# Patient Record
Sex: Male | Born: 1980 | Race: White | Hispanic: No | Marital: Married | Smoking: Never smoker
Health system: Southern US, Community
[De-identification: ages and names within clinical notes are randomized; demographics above are authoritative.]

## PROBLEM LIST (undated history)

## (undated) DIAGNOSIS — B019 Varicella without complication: Secondary | ICD-10-CM

## (undated) DIAGNOSIS — J45909 Unspecified asthma, uncomplicated: Secondary | ICD-10-CM

## (undated) DIAGNOSIS — M549 Dorsalgia, unspecified: Secondary | ICD-10-CM

## (undated) DIAGNOSIS — M542 Cervicalgia: Secondary | ICD-10-CM

## (undated) DIAGNOSIS — U071 COVID-19: Secondary | ICD-10-CM

## (undated) DIAGNOSIS — B009 Herpesviral infection, unspecified: Secondary | ICD-10-CM

## (undated) HISTORY — DX: Dorsalgia, unspecified: M54.9

## (undated) HISTORY — DX: COVID-19: U07.1

## (undated) HISTORY — DX: Herpesviral infection, unspecified: B00.9

## (undated) HISTORY — DX: Cervicalgia: M54.2

## (undated) HISTORY — DX: Varicella without complication: B01.9

## (undated) HISTORY — DX: Unspecified asthma, uncomplicated: J45.909

## (undated) HISTORY — PX: NO PAST SURGERIES: SHX2092

---

## 2014-05-16 ENCOUNTER — Emergency Department: Payer: Self-pay | Admitting: Internal Medicine

## 2014-05-23 ENCOUNTER — Emergency Department: Payer: Self-pay | Admitting: Emergency Medicine

## 2017-10-23 ENCOUNTER — Encounter: Payer: Self-pay | Admitting: Internal Medicine

## 2017-10-23 ENCOUNTER — Ambulatory Visit: Payer: 59 | Admitting: Internal Medicine

## 2017-10-23 ENCOUNTER — Ambulatory Visit (INDEPENDENT_AMBULATORY_CARE_PROVIDER_SITE_OTHER): Payer: 59

## 2017-10-23 VITALS — BP 126/66 | HR 67 | Temp 97.9°F | Ht 66.0 in | Wt 159.2 lb

## 2017-10-23 DIAGNOSIS — M545 Low back pain: Secondary | ICD-10-CM

## 2017-10-23 DIAGNOSIS — Z0000 Encounter for general adult medical examination without abnormal findings: Secondary | ICD-10-CM | POA: Diagnosis not present

## 2017-10-23 DIAGNOSIS — Z1329 Encounter for screening for other suspected endocrine disorder: Secondary | ICD-10-CM | POA: Diagnosis not present

## 2017-10-23 DIAGNOSIS — Z1159 Encounter for screening for other viral diseases: Secondary | ICD-10-CM | POA: Diagnosis not present

## 2017-10-23 DIAGNOSIS — M542 Cervicalgia: Secondary | ICD-10-CM | POA: Diagnosis not present

## 2017-10-23 DIAGNOSIS — Z23 Encounter for immunization: Secondary | ICD-10-CM | POA: Diagnosis not present

## 2017-10-23 DIAGNOSIS — Z13220 Encounter for screening for lipoid disorders: Secondary | ICD-10-CM | POA: Diagnosis not present

## 2017-10-23 DIAGNOSIS — G8929 Other chronic pain: Secondary | ICD-10-CM

## 2017-10-23 MED ORDER — METHOCARBAMOL 500 MG PO TABS: 500.0000 mg | ORAL_TABLET | Freq: Two times a day (BID) | ORAL | 2 refills | Status: DC | PRN

## 2017-10-23 NOTE — Progress Notes (Signed)
Chief Complaint  Patient presents with  . New Patient (Initial Visit)  . Back Pain  . Neck Pain   New patient here with wife  1. C/o neck and low back pain chronic worsening x 6 months no h/o MVA/trauma tries Ibuprofen/Alevel w/o help. He does not do heavy lifting with work. He does report tingling down arms. He saw chiropractor 3 years ago but it did not help. Pain is achy/sharp at times 10/10. He has never tried PT for pain. He does have pain with ROM but no limited ROM neck.  2. Annual physical    Review of Systems  Constitutional: Negative for weight loss.  HENT: Negative for hearing loss.   Eyes: Negative for blurred vision.  Cardiovascular: Negative for chest pain.  Gastrointestinal: Negative for abdominal pain.  Musculoskeletal: Positive for back pain and neck pain.  Skin: Negative for rash.       +moles   Neurological: Negative for headaches.  Psychiatric/Behavioral: Negative for memory loss.       +angry at times    Past Medical History:  Diagnosis Date  . Asthma    childhood   . Chicken pox    Past Surgical History:  Procedure Laterality Date  . NO PAST SURGERIES     Family History  Problem Relation Age of Onset  . COPD Mother        never smoker   . Cancer Father        tongue cancer smoker   . Hypertension Father    Social History   Socioeconomic History  . Marital status: Married    Spouse name: Not on file  . Number of children: Not on file  . Years of education: Not on file  . Highest education level: Not on file  Social Needs  . Financial resource strain: Not on file  . Food insecurity - worry: Not on file  . Food insecurity - inability: Not on file  . Transportation needs - medical: Not on file  . Transportation needs - non-medical: Not on file  Occupational History  . Not on file  Tobacco Use  . Smoking status: Never Smoker  . Smokeless tobacco: Never Used  Substance and Sexual Activity  . Alcohol use: Yes  . Drug use: No  . Sexual  activity: Yes    Partners: Female  Other Topics Concern  . 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     Wears seatbelt   Owns gun    Feels safe in relationship    Current Meds  Medication Sig  . valACYclovir (VALTREX) 500 MG tablet Take 500 mg by mouth 2 (two) times daily.   No Known Allergies No results found for this or any previous visit (from the past 2160 hour(s)). Objective  Body mass index is 25.7 kg/m. Wt Readings from Last 3 Encounters:  10/23/17 159 lb 3.2 oz (72.2 kg)   Temp Readings from Last 3 Encounters:  10/23/17 97.9 F (36.6 C) (Oral)   BP Readings from Last 3 Encounters:  10/23/17 126/66   Pulse Readings from Last 3 Encounters:  10/23/17 67   O2 sat room air 98%  Physical Exam  Constitutional: He is oriented to person, place, and time and well-developed, well-nourished, and in no distress. Vital signs are normal.  HENT:  Head: Normocephalic and atraumatic.  Mouth/Throat: Oropharynx is clear and moist and mucous membranes are normal.  Eyes: Conjunctivae are normal. Pupils are equal, round, and reactive to light.  Cardiovascular: Normal rate, regular rhythm and normal heart sounds.  No murmur heard. Pulmonary/Chest: Effort normal and breath sounds normal.  Musculoskeletal:       Cervical back: He exhibits tenderness.       Lumbar back: He exhibits no tenderness.       Back:  Neurological: He is alert and oriented to person, place, and time. Gait normal. Gait normal.  Skin: Skin is warm, dry and intact.  Psychiatric: Mood, memory, affect and judgment normal.  Nursing note and vitals reviewed.   Assessment   1. Chronic cervicalgia and low back pain  2. Hm/physical  Plan  1.  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  Declines flu  Given Tdap today  Will check fasting labs Thursday CMET, CBC, UA, lipid, TSH, T4, hep B titer  Declines STD check   Congratulated on never a smoker  rec healthy diet choices   Provider: Dr. French Anaracy [facility]

## 2017-10-23 NOTE — Patient Instructions (Signed)
Please try robaxin at night when you are not working  Try Tylenol 500 mg up to 6x per day  Try Ibuprofen 200 mg no more than 12 pills in 1 day  F/u in 6 weeks sooner if needed  Try over the counter Salonpas for pain topical gel/patch   Cervical Radiculopathy Cervical radiculopathy means that a nerve in the neck is pinched or bruised. This can cause pain or loss of feeling (numbness) that runs from your neck to your arm and fingers. Follow these instructions at home: Managing pain  Take over-the-counter and prescription medicines only as told by your doctor.  If directed, put ice on the injured or painful area. ? Put ice in a plastic bag. ? Place a towel between your skin and the bag. ? Leave the ice on for 20 minutes, 2-3 times per day.  If ice does not help, you can try using heat. Take a warm shower or warm bath, or use a heat pack as told by your doctor.  You may try a gentle neck and shoulder massage. Activity  Rest as needed. Follow instructions from your doctor about any activities to avoid.  Do exercises as told by your doctor or physical therapist. General instructions  If you were given a soft collar, wear it as told by your doctor.  Use a flat pillow when you sleep.  Keep all follow-up visits as told by your doctor. This is important. Contact a doctor if:  Your condition does not improve with treatment. Get help right away if:  Your pain gets worse and is not controlled with medicine.  You lose feeling or feel weak in your hand, arm, face, or leg.  You have a fever.  You have a stiff neck.  You cannot control when you poop or pee (have incontinence).  You have trouble with walking, balance, or talking. This information is not intended to replace advice given to you by your health care provider. Make sure you discuss any questions you have with your health care provider. Document Released: 08/11/2011 Document Revised: 01/28/2016 Document Reviewed:  10/16/2014 Elsevier Interactive Patient Education  2018 [employer]  Back Pain, Adult Many adults have back pain from time to time. Common causes of back pain include:  A strained muscle or ligament.  Wear and tear (degeneration) of the spinal disks.  Arthritis.  A hit to the back.  Back pain can be short-lived (acute) or last a long time (chronic). A physical exam, lab tests, and imaging studies may be done to find the cause of your pain. Follow these instructions at home: Managing pain and stiffness  Take over-the-counter and prescription medicines only as told by your health care provider.  If directed, apply heat to the affected area as often as told by your health care provider. Use the heat source that your health care provider recommends, such as a moist heat pack or a heating pad. ? Place a towel between your skin and the heat source. ? Leave the heat on for 20-30 minutes. ? Remove the heat if your skin turns bright red. This is especially important if you are unable to feel pain, heat, or cold. You have a greater risk of getting burned.  If directed, apply ice to the injured area: ? Put ice in a plastic bag. ? Place a towel between your skin and the bag. ? Leave the ice on for 20 minutes, 2-3 times a day for the first 2-3 days. Activity  Do not  stay in bed. Resting more than 1-2 days can delay your recovery.  Take short walks on even surfaces as soon as you are able. Try to increase the length of time you walk each day.  Do not sit, drive, or stand in one place for more than 30 minutes at a time. Sitting or standing for long periods of time can put stress on your back.  Use proper lifting techniques. When you bend and lift, use positions that put less stress on your back: ? [place] your knees. ? Keep the load close to your body. ? Avoid twisting.  Exercise regularly as told by your health care provider. Exercising will help your back heal faster. This also helps  prevent back injuries by keeping muscles strong and flexible.  Your health care provider may recommend that you see a physical therapist. This person can help you come up with a safe exercise program. Do any exercises as told by your physical therapist. Lifestyle  Maintain a healthy weight. Extra weight puts stress on your back and makes it difficult to have good posture.  Avoid activities or situations that make you feel anxious or stressed. Learn ways to manage anxiety and stress. One way to manage stress is through exercise. Stress and anxiety increase muscle tension and can make back pain worse. General instructions  Sleep on a firm mattress in a comfortable position. Try lying on your side with your knees slightly bent. If you lie on your back, put a pillow under your knees.  Follow your treatment plan as told by your health care provider. This may include: ? Cognitive or behavioral therapy. ? Acupuncture or massage therapy. ? Meditation or yoga. Contact a health care provider if:  You have pain that is not relieved with rest or medicine.  You have increasing pain going down into your legs or buttocks.  Your pain does not improve in 2 weeks.  You have pain at night.  You lose weight.  You have a fever or chills. Get help right away if:  You develop new bowel or bladder control problems.  You have unusual weakness or numbness in your arms or legs.  You develop nausea or vomiting.  You develop abdominal pain.  You feel faint. Summary  Many adults have back pain from time to time. A physical exam, lab tests, and imaging studies may be done to find the cause of your pain.  Use proper lifting techniques. When you bend and lift, use positions that put less stress on your back.  Take over-the-counter and prescription medicines and apply heat or ice as directed by your health care provider. This information is not intended to replace advice given to you by your health care  provider. Make sure you discuss any questions you have with your health care provider. Document Released: 08/22/2005 Document Revised: 09/26/2016 Document Reviewed: 09/26/2016 Elsevier Interactive Patient Education  [employer].

## 2017-10-23 NOTE — Progress Notes (Signed)
Pre visit review using our clinic review tool, if applicable. No additional management support is needed unless otherwise documented below in the visit note. 

## 2017-10-24 ENCOUNTER — Other Ambulatory Visit: Payer: Self-pay | Admitting: Internal Medicine

## 2017-10-24 DIAGNOSIS — M545 Low back pain: Secondary | ICD-10-CM

## 2017-10-24 DIAGNOSIS — M542 Cervicalgia: Secondary | ICD-10-CM

## 2017-10-24 DIAGNOSIS — G8929 Other chronic pain: Secondary | ICD-10-CM

## 2017-10-26 ENCOUNTER — Other Ambulatory Visit (INDEPENDENT_AMBULATORY_CARE_PROVIDER_SITE_OTHER): Payer: 59

## 2017-10-26 DIAGNOSIS — Z0000 Encounter for general adult medical examination without abnormal findings: Secondary | ICD-10-CM

## 2017-10-26 DIAGNOSIS — Z1159 Encounter for screening for other viral diseases: Secondary | ICD-10-CM

## 2017-10-26 DIAGNOSIS — Z13220 Encounter for screening for lipoid disorders: Secondary | ICD-10-CM | POA: Diagnosis not present

## 2017-10-26 DIAGNOSIS — Z1329 Encounter for screening for other suspected endocrine disorder: Secondary | ICD-10-CM

## 2017-10-26 LAB — URINALYSIS, ROUTINE W REFLEX MICROSCOPIC
BILIRUBIN URINE: NEGATIVE
HGB URINE DIPSTICK: NEGATIVE
Ketones, ur: NEGATIVE
LEUKOCYTES UA: NEGATIVE
NITRITE: NEGATIVE
RBC / HPF: NONE SEEN (ref 0–?)
Specific Gravity, Urine: 1.015 (ref 1.000–1.030)
TOTAL PROTEIN, URINE-UPE24: NEGATIVE
URINE GLUCOSE: NEGATIVE
Urobilinogen, UA: 0.2 (ref 0.0–1.0)
pH: 7 (ref 5.0–8.0)

## 2017-10-26 LAB — CBC WITH DIFFERENTIAL/PLATELET
Basophils Absolute: 0.1 10*3/uL (ref 0.0–0.1)
Basophils Relative: 1.2 % (ref 0.0–3.0)
EOS PCT: 5.8 % — AB (ref 0.0–5.0)
Eosinophils Absolute: 0.3 10*3/uL (ref 0.0–0.7)
HCT: 45.9 % (ref 39.0–52.0)
HEMOGLOBIN: 15.5 g/dL (ref 13.0–17.0)
Lymphocytes Relative: 34.1 % (ref 12.0–46.0)
Lymphs Abs: 1.6 10*3/uL (ref 0.7–4.0)
MCHC: 33.6 g/dL (ref 30.0–36.0)
MCV: 94.2 fl (ref 78.0–100.0)
MONO ABS: 0.6 10*3/uL (ref 0.1–1.0)
MONOS PCT: 12.1 % — AB (ref 3.0–12.0)
Neutro Abs: 2.2 10*3/uL (ref 1.4–7.7)
Neutrophils Relative %: 46.8 % (ref 43.0–77.0)
Platelets: 107 10*3/uL — ABNORMAL LOW (ref 150.0–400.0)
RBC: 4.87 Mil/uL (ref 4.22–5.81)
RDW: 12.9 % (ref 11.5–15.5)
WBC: 4.8 10*3/uL (ref 4.0–10.5)

## 2017-10-26 LAB — COMPREHENSIVE METABOLIC PANEL
ALK PHOS: 63 U/L (ref 39–117)
ALT: 21 U/L (ref 0–53)
AST: 20 U/L (ref 0–37)
Albumin: 4.1 g/dL (ref 3.5–5.2)
BILIRUBIN TOTAL: 0.9 mg/dL (ref 0.2–1.2)
BUN: 22 mg/dL (ref 6–23)
CO2: 31 mEq/L (ref 19–32)
CREATININE: 1.24 mg/dL (ref 0.40–1.50)
Calcium: 9.3 mg/dL (ref 8.4–10.5)
Chloride: 101 mEq/L (ref 96–112)
GFR: 69.44 mL/min (ref >60.00)
Glucose, Bld: 94 mg/dL (ref 70–99)
POTASSIUM: 4.2 meq/L (ref 3.5–5.1)
SODIUM: 137 meq/L (ref 135–145)
TOTAL PROTEIN: 7 g/dL (ref 6.0–8.3)

## 2017-10-26 LAB — LIPID PANEL
Cholesterol: 129 mg/dL (ref 0–200)
HDL: 43.5 mg/dL (ref >39.00)
LDL Cholesterol: 72 mg/dL (ref 0–99)
NONHDL: 85.6
Total CHOL/HDL Ratio: 3
Triglycerides: 69 mg/dL (ref 0.0–149.0)
VLDL: 13.8 mg/dL (ref 0.0–40.0)

## 2017-10-26 LAB — TSH: TSH: 2.56 u[IU]/mL (ref 0.35–4.50)

## 2017-10-26 LAB — T4, FREE: Free T4: 0.65 ng/dL (ref 0.60–1.60)

## 2017-10-27 ENCOUNTER — Telehealth: Payer: Self-pay | Admitting: Internal Medicine

## 2017-10-27 LAB — HEPATITIS B SURFACE ANTIBODY, QUANTITATIVE

## 2017-10-27 NOTE — Telephone Encounter (Signed)
Copied from CRM [phone]. Topic: Quick Communication - See Telephone Encounter >> Oct 27, 2017  4:47 PM Arlyss Gandyichardson, Bessie Boyte N, NT wrote: CRM for notification. See Telephone encounter for: Pt calling and would like a call back with a question regarding the low platelets that his lab results showed.  10/27/17.

## 2017-10-30 NOTE — Telephone Encounter (Signed)
Pt. Called to ask about low platelets again. Reassured his provider will follow up with him at his next appointment. Verbalizes understanding.

## 2017-12-04 ENCOUNTER — Telehealth: Payer: Self-pay | Admitting: Internal Medicine

## 2017-12-04 ENCOUNTER — Other Ambulatory Visit: Payer: Self-pay | Admitting: Internal Medicine

## 2017-12-04 ENCOUNTER — Ambulatory Visit: Payer: 59 | Admitting: Internal Medicine

## 2017-12-04 DIAGNOSIS — D696 Thrombocytopenia, unspecified: Secondary | ICD-10-CM

## 2017-12-04 DIAGNOSIS — Z0289 Encounter for other administrative examinations: Secondary | ICD-10-CM

## 2017-12-04 NOTE — Telephone Encounter (Signed)
Patient has scheduoled labs for tomorrow @1100 

## 2017-12-04 NOTE — Telephone Encounter (Signed)
Have CBC before next visit   TMS

## 2017-12-04 NOTE — Telephone Encounter (Signed)
Please advise. I will call patient back.

## 2017-12-04 NOTE — Telephone Encounter (Signed)
Chris Baker called regarding his labs from Feb. He forgot about his appointment for today, tied up with work. He was asking about his low platelet count and wondered if he needed to make an appointment and  come in to have his blood redrawn this week. Or can it wait until his appointment in May? He is requesting a call back please.

## 2017-12-05 ENCOUNTER — Other Ambulatory Visit (INDEPENDENT_AMBULATORY_CARE_PROVIDER_SITE_OTHER): Payer: 59

## 2017-12-05 DIAGNOSIS — D696 Thrombocytopenia, unspecified: Secondary | ICD-10-CM | POA: Diagnosis not present

## 2017-12-05 LAB — CBC WITH DIFFERENTIAL/PLATELET
BASOS PCT: 1.4 % (ref 0.0–3.0)
Basophils Absolute: 0.1 10*3/uL (ref 0.0–0.1)
EOS PCT: 2.4 % (ref 0.0–5.0)
Eosinophils Absolute: 0.1 10*3/uL (ref 0.0–0.7)
HCT: 46.5 % (ref 39.0–52.0)
HEMOGLOBIN: 15.7 g/dL (ref 13.0–17.0)
Lymphocytes Relative: 31.7 % (ref 12.0–46.0)
Lymphs Abs: 1.5 10*3/uL (ref 0.7–4.0)
MCHC: 33.7 g/dL (ref 30.0–36.0)
MCV: 94.2 fl (ref 78.0–100.0)
Monocytes Absolute: 0.4 10*3/uL (ref 0.1–1.0)
Monocytes Relative: 8.8 % (ref 3.0–12.0)
Neutro Abs: 2.7 10*3/uL (ref 1.4–7.7)
Neutrophils Relative %: 55.7 % (ref 43.0–77.0)
Platelets: 180 10*3/uL (ref 150.0–400.0)
RBC: 4.94 Mil/uL (ref 4.22–5.81)
RDW: 12.8 % (ref 11.5–15.5)
WBC: 4.8 10*3/uL (ref 4.0–10.5)

## 2017-12-28 ENCOUNTER — Ambulatory Visit: Payer: 59 | Admitting: Internal Medicine

## 2017-12-28 ENCOUNTER — Encounter: Payer: Self-pay | Admitting: Internal Medicine

## 2017-12-28 ENCOUNTER — Ambulatory Visit (INDEPENDENT_AMBULATORY_CARE_PROVIDER_SITE_OTHER): Payer: 59

## 2017-12-28 VITALS — BP 130/70 | HR 100 | Ht 66.0 in | Wt 159.0 lb

## 2017-12-28 DIAGNOSIS — J4520 Mild intermittent asthma, uncomplicated: Secondary | ICD-10-CM

## 2017-12-28 DIAGNOSIS — M542 Cervicalgia: Secondary | ICD-10-CM | POA: Diagnosis not present

## 2017-12-28 DIAGNOSIS — R0602 Shortness of breath: Secondary | ICD-10-CM

## 2017-12-28 DIAGNOSIS — E291 Testicular hypofunction: Secondary | ICD-10-CM | POA: Diagnosis not present

## 2017-12-28 DIAGNOSIS — G8929 Other chronic pain: Secondary | ICD-10-CM

## 2017-12-28 DIAGNOSIS — N529 Male erectile dysfunction, unspecified: Secondary | ICD-10-CM | POA: Diagnosis not present

## 2017-12-28 DIAGNOSIS — M545 Low back pain: Secondary | ICD-10-CM

## 2017-12-28 DIAGNOSIS — Z23 Encounter for immunization: Secondary | ICD-10-CM | POA: Diagnosis not present

## 2017-12-28 MED ORDER — ALBUTEROL SULFATE HFA 108 (90 BASE) MCG/ACT IN AERS: 1.0000 | INHALATION_SPRAY | Freq: Four times a day (QID) | RESPIRATORY_TRACT | 11 refills | Status: DC | PRN

## 2017-12-28 MED ORDER — METHOCARBAMOL 750 MG PO TABS: 750.0000 mg | ORAL_TABLET | Freq: Two times a day (BID) | ORAL | 2 refills | Status: DC | PRN

## 2017-12-28 NOTE — Patient Instructions (Addendum)
Try Allegra 12 hour not D  Think about physical therapy  You will need hepatitis B vaccine 01/27/18 and 06/29/18  F/u in 3-4 months sooner if needed   Hyperhidrosis It is normal to sweat when you are hot, being physically active, or feeling anxious. Sweating is a necessary function for your body. However, hyperhidrosis is when you sweat too much (excessively). Although hyperhidrosis is not dangerous, it can make you feel embarrassed. There are two kinds of hyperhidrosis:  Primary hyperhidrosis. The sweating usually localizes in one part of your body, such as your underarms, or in a few areas, such as your feet, face, armpits, and hands. This is the more common kind of hyperhidrosis.  Secondary hyperhidrosis. This type more likely affects your entire body.  What are the causes? The cause of your hyperhidrosis depends on the kind you have.  Primary hyperhidrosis may be caused by having sweat glands that are more active than normal.  Secondary hyperhidrosis is caused by an underlying condition. Possible conditions include: ? Diabetes. ? Gout. ? Certain medicines. ? Anxiety. ? Stroke. ? Obesity. ? Menopause. ? Overactive thyroid (hyperthyroidism). ? Tumors. ? Frostbite. ? Certain types of cancers. ? Alcoholism. ? Injury to your nervous system. ? Stroke. ? Parkinson disease.  What increases the risk? You may be at an increased risk for primary hyperhidrosis if you have a family history of it. What are the signs or symptoms? General symptoms of hyperhidrosis may include:  Feeling like you are sweating constantly, even while you are resting.  Having skin that peels or gets paler or softer in the areas where you sweat the most.  Being able to see sweat on your skin.  Symptoms of primary hyperhidrosis may include:  Sweating in specific areas, such as your armpits, palms, feet, and face.  Sweating in the same location on both sides of your body.  Sweating only during the  day.  Symptoms of secondary hyperhidrosis may include:  Sweating all over your body.  Sweating even while you sleep.  How is this diagnosed? Hyperhidrosis may be diagnosed by:  Medical history and physical exam.  Testing, such as: ? Sweat test. ? Paper test.  How is this treated? Your treatment will depend on the kind of hyperhidrosis you have and the parts of your body that are affected. If your hyperhidrosis is caused by an underlying condition, your treatment will address the cause. Treatment may include:  Strong antiperspirants. Your health care provider may give you a prescription.  Medicines taken by mouth.  Medicines injected by your health care provider. These may include small amounts of botulinum toxin.  Iontophoresis. This is a procedure that temporarily turns off the sweat glands in your hands and feet.  Surgery to remove your sweat glands.  Sympathectomy. This is a procedure that cuts or destroys your nerves so that they do not send a signal to sweat.  Follow these instructions at home:  Take medicines only as directed by your health care provider.  Use antiperspirants as directed by your health care provider.  Limit or avoid foods or beverages that seem to increase your chances of sweating, such as: ? Spicy food. ? Caffeine. ? Alcohol. ? Foods that contain MSG.  If your feet sweat: ? Wear sandals, when possible. ? Do not wear cotton socks. Wear socks that remove or wick moisture from your feet. ? Wear leather shoes. ? Avoid wearing the same pair of shoes two days in a row.  Consider joining a hyperhidrosis  support group. Contact a health care provider if:  You have new symptoms.  Your symptoms get worse. This information is not intended to replace advice given to you by your health care provider. Make sure you discuss any questions you have with your health care provider. Document Released: 10/21/2005 Document Revised: 01/28/2016 Document  Reviewed: 04/01/2014 Elsevier Interactive Patient Education  2018 [employer]  Shortness of Breath, Adult Shortness of breath means you have trouble breathing. Your lungs are organs for breathing. Follow these instructions at home: Pay attention to any changes in your symptoms. Take these actions to help with your condition:  Do not smoke. Smoking can cause shortness of breath. If you need help to quit smoking, ask your doctor.  Avoid things that can make it harder to breathe, such as: ? Mold. ? Dust. ? Air pollution. ? Chemical smells. ? Things that can cause allergy symptoms (allergens), if you have allergies.  Keep your living space clean and free of mold and dust.  Rest as needed. Slowly return to your usual activities.  Take over-the-counter and prescription medicines, including oxygen and inhaled medicines, only as told by your doctor.  Keep all follow-up visits as told by your doctor. This is important.  Contact a doctor if:  Your condition does not get better as soon as expected.  You have a hard time doing your normal activities, even after you rest.  You have new symptoms. Get help right away if:  You have trouble breathing when you are resting.  You feel light-headed or you faint.  You have a cough that is not helped by medicines.  You cough up blood.  You have pain with breathing.  You have pain in your chest, arms, shoulders, or belly (abdomen).  You have a fever.  You cannot walk up stairs.  You cannot exercise the way you normally do. This information is not intended to replace advice given to you by your health care provider. Make sure you discuss any questions you have with your health care provider. Document Released: 02/08/2008 Document Revised: 09/08/2016 Document Reviewed: 09/08/2016 Elsevier Interactive Patient Education  2017 [employer]  Erectile Dysfunction Erectile dysfunction (ED) is the inability to get or keep an erection in  order to have sexual intercourse. Erectile dysfunction may include:  Inability to get an erection.  Lack of enough hardness of the erection to allow penetration.  Loss of the erection before sex is finished.  What are the causes? This condition may be caused by:  Certain medicines, such as: ? Pain relievers. ? Antihistamines. ? Antidepressants. ? Blood pressure medicines. ? Water pills (diuretics). ? Ulcer medicines. ? Muscle relaxants. ? Drugs.  Excessive drinking.  Psychological causes, such as: ? Anxiety. ? Depression. ? Sadness. ? Exhaustion. ? Performance fear. ? Stress.  Physical causes, such as: ? Artery problems. This may include diabetes, smoking, liver disease, or atherosclerosis. ? High blood pressure. ? Hormonal problems, such as low testosterone. ? Obesity. ? Nerve problems. This may include back or pelvic injuries, diabetes mellitus, multiple sclerosis, or Parkinson disease.  What are the signs or symptoms? Symptoms of this condition include:  Inability to get an erection.  Lack of enough hardness of the erection to allow penetration.  Loss of the erection before sex is finished.  Normal erections at some times, but with frequent unsatisfactory episodes.  Low sexual satisfaction in either partner due to erection problems.  A curved penis occurring with erection. The curve may cause pain or the  penis may be too curved to allow for intercourse.  Never having nighttime erections.  How is this diagnosed? This condition is often diagnosed by:  Performing a physical exam to find other diseases or specific problems with the penis.  Asking you detailed questions about the problem.  Performing blood tests to check for diabetes mellitus or to measure hormone levels.  Performing other tests to check for underlying health conditions.  Performing an ultrasound exam to check for scarring.  Performing a test to check blood flow to the penis.  Doing  a sleep study at home to measure nighttime erections.  How is this treated? This condition may be treated by:  Medicine taken by mouth to help you achieve an erection (oral medicine).  Hormone replacement therapy to replace low testosterone levels.  Medicine that is injected into the penis. Your health care provider may instruct you how to give yourself these injections at home.  Vacuum pump. This is a pump with a ring on it. The pump and ring are placed on the penis and used to create pressure that helps the penis become erect.  Penile implant surgery. In this procedure, you may receive: ? An inflatable implant. This consists of cylinders, a pump, and a reservoir. The cylinders can be inflated with a fluid that helps to create an erection, and they can be deflated after intercourse. ? A semi-rigid implant. This consists of two silicone rubber rods. The rods provide some rigidity. They are also flexible, so the penis can both curve downward in its normal position and become straight for sexual intercourse.  Blood vessel surgery, to improve blood flow to the penis. During this procedure, a blood vessel from a different part of the body is placed into the penis to allow blood to flow around (bypass) damaged or blocked blood vessels.  Lifestyle changes, such as exercising more, losing weight, and quitting smoking.  Follow these instructions at home: Medicines  Take over-the-counter and prescription medicines only as told by your health care provider. Do not increase the dosage without first discussing it with your health care provider.  If you are using self-injections, perform injections as directed by your health care provider. Make sure to avoid any veins that are on the surface of the penis. After giving an injection, apply pressure to the injection site for 5 minutes. General instructions  Exercise regularly, as directed by your health care provider. Work with your health care provider  to lose weight, if needed.  Do not use any products that contain nicotine or tobacco, such as cigarettes and e-cigarettes. If you need help quitting, ask your health care provider.  Before using a vacuum pump, read the instructions that come with the pump and discuss any questions with your health care provider.  Keep all follow-up visits as told by your health care provider. This is important. Contact a health care provider if:  You feel nauseous.  You vomit. Get help right away if:  You are taking oral or injectable medicines and you have an erection that lasts longer than 4 hours. If your health care provider is unavailable, go to the nearest emergency room for evaluation. An erection that lasts much longer than 4 hours can result in permanent damage to your penis.  You have severe pain in your groin or abdomen.  You develop redness or severe swelling of your penis.  You have redness spreading up into your groin or lower abdomen.  You are unable to urinate.  You  experience chest pain or a rapid heart beat (palpitations) after taking oral medicines. Summary  Erectile dysfunction (ED) is the inability to get or keep an erection during sexual intercourse. This problem can usually be treated successfully.  This condition is diagnosed based on a physical exam, your symptoms, and tests to determine the cause. Treatment varies depending on the cause, and may include medicines, hormone therapy, surgery, or vacuum pump.  You may need follow-up visits to make sure that you are using your medicines or devices correctly.  Get help right away if you are taking or injecting medicines and you have an erection that lasts longer than 4 hours. This information is not intended to replace advice given to you by your health care provider. Make sure you discuss any questions you have with your health care provider. Document Released: 08/19/2000 Document Revised: 09/07/2016 Document Reviewed:  09/07/2016 Elsevier Interactive Patient Education  2017 [employer]   Hepatitis B Vaccine, Recombinant injection What is this medicine? HEPATITIS B VACCINE (hep uh TAHY tis B VAK seen) is a vaccine. It is used to prevent an infection with the hepatitis B virus. This medicine may be used for other purposes; ask your health care provider or pharmacist if you have questions. COMMON BRAND NAME(S): Engerix-B, Recombivax HB What should I tell my health care provider before I take this medicine? They need to know if you have any of these conditions: -fever, infection -heart disease -hepatitis B infection -immune system problems -kidney disease -an unusual or allergic reaction to vaccines, yeast, other medicines, foods, dyes, or preservatives -pregnant or trying to get pregnant -breast-feeding How should I use this medicine? This vaccine is for injection into a muscle. It is given by a health care professional. A copy of Vaccine Information Statements will be given before each vaccination. Read this sheet carefully each time. The sheet may change frequently. Talk to your pediatrician regarding the use of this medicine in children. While this drug may be prescribed for children as young as newborn for selected conditions, precautions do apply. Overdosage: If you think you have taken too much of this medicine contact a poison control center or emergency room at once. NOTE: This medicine is only for you. Do not share this medicine with others. What if I miss a dose? It is important not to miss your dose. Call your doctor or health care professional if you are unable to keep an appointment. What may interact with this medicine? -medicines that suppress your immune function like adalimumab, anakinra, infliximab -medicines to treat cancer -steroid medicines like prednisone or cortisone This list may not describe all possible interactions. Give your health care provider a list of all the  medicines, herbs, non-prescription drugs, or dietary supplements you use. Also tell them if you smoke, drink alcohol, or use illegal drugs. Some items may interact with your medicine. What should I watch for while using this medicine? See your health care provider for all shots of this vaccine as directed. You must have 3 shots of this vaccine for protection from hepatitis B infection. Tell your doctor right away if you have any serious or unusual side effects after getting this vaccine. What side effects may I notice from receiving this medicine? Side effects that you should report to your doctor or health care professional as soon as possible: -allergic reactions like skin rash, itching or hives, swelling of the face, lips, or tongue -breathing problems -confused, irritated -fast, irregular heartbeat -flu-like syndrome -numb, tingling pain -seizures -unusually  weak or tired Side effects that usually do not require medical attention (report to your doctor or health care professional if they continue or are bothersome): -diarrhea -fever -headache -loss of appetite -muscle pain -nausea -pain, redness, swelling, or irritation at site where injected -tiredness This list may not describe all possible side effects. Call your doctor for medical advice about side effects. You may report side effects to FDA at 1-800-FDA-1088. Where should I keep my medicine? This drug is given in a hospital or clinic and will not be stored at home. NOTE: This sheet is a summary. It may not cover all possible information. If you have questions about this medicine, talk to your doctor, pharmacist, or health care provider.  2018 Elsevier/Gold Standard (2013-12-23 13:26:01)

## 2017-12-28 NOTE — Progress Notes (Addendum)
Chief Complaint  Patient presents with  . Follow-up    neck pain and also too check testosterone   F/u  1. Reviewed cervical and lumbar Xrays + mild to moderate disc space narrowing  2. +ED when this happens he sweats excessively and cant keep erection he wants testosterone checked. This has been going on x 6 months ED and excess sweating 1 year. He does not want to try medication he reports working new shift and leaves for work 12 am sleeping 4-5 hours and hoping this could be cause of ED instead of low testosterone  3. C/o intermittent sob 2-3x per week at rest or exertion h/o asthma. No CP. Sx's worse x 1 month taking Allegra D.  4. Agreeable to hep B vaccine today   Review of Systems  Constitutional: Negative for weight loss.  HENT: Negative for hearing loss.   Eyes: Negative for blurred vision.  Respiratory: Positive for shortness of breath.   Cardiovascular: Negative for chest pain.  Gastrointestinal: Negative for abdominal pain.  Genitourinary:       +ED  Musculoskeletal: Positive for back pain and neck pain.  Skin:       +sweating  Neurological: Negative for headaches.  Psychiatric/Behavioral: Negative for depression. The patient has insomnia.    Past Medical History:  Diagnosis Date  . Asthma    childhood   . Chicken pox   . Herpes simplex infection    Past Surgical History:  Procedure Laterality Date  . NO PAST SURGERIES     Family History  Problem Relation Age of Onset  . COPD Mother        never smoker   . Cancer Father        tongue cancer smoker   . Hypertension Father    Social History   Socioeconomic History  . Marital status: Married    Spouse name: Not on file  . Number of children: Not on file  . Years of education: Not on file  . Highest education level: Not on file  Occupational History  . Not on file  Social Needs  . Financial resource strain: Not on file  . Food insecurity:    Worry: Not on file    Inability: Not on file  .  Transportation needs:    Medical: Not on file    Non-medical: Not on file  Tobacco Use  . Smoking status: Never Smoker  . Smokeless tobacco: Never Used  Substance and Sexual Activity  . Alcohol use: Yes  . Drug use: No  . Sexual activity: Yes    Partners: Female  Lifestyle  . Physical activity:    Days per week: Not on file    Minutes per session: Not on file  . Stress: Not on file  Relationships  . Social connections:    Talks on phone: Not on file    Gets together: Not on file    Attends religious service: Not on file    Active member of club or organization: Not on file    Attends meetings of clubs or organizations: Not on file    Relationship status: Not on file  . Intimate partner violence:    Fear of current or ex partner: Not on file    Emotionally abused: Not on file    Physically abused: Not on file    Forced sexual activity: Not on file  Other Topics Concern  . 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     Wears seatbelt   Owns gun    Feels safe in relationship    Current Meds  Medication Sig  . methocarbamol (ROBAXIN) 750 MG tablet Take 1 tablet (750 mg total) by mouth 2 (two) times daily as needed for muscle spasms.  . valACYclovir (VALTREX) 500 MG tablet Take 500 mg by mouth 2 (two) times daily.  . [DISCONTINUED] methocarbamol (ROBAXIN) 500 MG tablet Take 1 tablet (500 mg total) by mouth 2 (two) times daily as needed for muscle spasms.   No Known Allergies Recent Results (from the past 2160 hour(s))  Hepatitis B surface antibody     Status: Abnormal   Collection Time: 10/26/17  9:36 AM  Result Value Ref Range   Hepatitis B-Post <5 (L) > OR = 10 mIU/mL    Comment: . Patient does not have immunity to hepatitis B virus. . For additional information, please refer to http://education.[facility].com/faq/FAQ105 (This link is being provided for  informational/ educational purposes only).   T4, free     Status: None   Collection Time: 10/26/17  9:36 AM  Result Value Ref Range   Free T4 0.65 0.60 - 1.60 ng/dL    Comment: Specimens from patients who are undergoing biotin therapy and /or ingesting biotin supplements may contain high levels of biotin.  The higher biotin concentration in these specimens interferes with this Free T4 assay.  Specimens that contain high levels  of biotin may cause false high results for this Free T4 assay.  Please interpret results in light of the total clinical presentation of the patient.    TSH     Status: None   Collection Time: 10/26/17  9:36 AM  Result Value Ref Range   TSH 2.56 0.35 - 4.50 uIU/mL  Urinalysis, Routine w reflex microscopic     Status: None   Collection Time: 10/26/17  9:36 AM  Result Value Ref Range   Color, Urine YELLOW Yellow;Lt. Yellow   APPearance CLEAR Clear   Specific Gravity, Urine 1.015 1.000 - 1.030   pH 7.0 5.0 - 8.0   Total Protein, Urine NEGATIVE Negative   Urine Glucose NEGATIVE Negative   Ketones, ur NEGATIVE Negative   Bilirubin Urine NEGATIVE Negative   Hgb urine dipstick NEGATIVE Negative   Urobilinogen, UA 0.2 0.0 - 1.0   Leukocytes, UA NEGATIVE Negative   Nitrite NEGATIVE Negative   WBC, UA 0-2/hpf 0-2/hpf   RBC / HPF none seen 0-2/hpf   Squamous Epithelial / LPF Rare(0-4/hpf) Rare(0-4/hpf)  Lipid panel     Status: None   Collection Time: 10/26/17  9:36 AM  Result Value Ref Range   Cholesterol 129 0 - 200 mg/dL    Comment: ATP III Classification       Desirable:  < 200 mg/dL               Borderline High:  200 - 239 mg/dL          High:  > = 098 mg/dL   Triglycerides 11.9 0.0 - 149.0 mg/dL    Comment: Normal:  <147 mg/dLBorderline High:  150 - 199 mg/dL   HDL 82.95 >62.13 mg/dL   VLDL 08.6 0.0 - 57.8 mg/dL   LDL Cholesterol 72 0 - 99 mg/dL   Total CHOL/HDL Ratio 3     Comment:  Men          Women1/2 Average Risk     3.4           3.3Average Risk          5.0          4.42X Average Risk          9.6          7.13X Average Risk          15.0          11.0                       NonHDL 85.60     Comment: NOTE:  Non-HDL goal should be 30 mg/dL higher than patient's LDL goal (i.e. LDL goal of < 70 mg/dL, would have non-HDL goal of < 100 mg/dL)  CBC with Differential/Platelet     Status: Abnormal   Collection Time: 10/26/17  9:36 AM  Result Value Ref Range   WBC 4.8 4.0 - 10.5 K/uL   RBC 4.87 4.22 - 5.81 Mil/uL   Hemoglobin 15.5 13.0 - 17.0 g/dL   HCT 16.145.9 09.639.0 - 04.552.0 %   MCV 94.2 78.0 - 100.0 fl   MCHC 33.6 30.0 - 36.0 g/dL   RDW 40.912.9 81.111.5 - 91.415.5 %   Platelets 107.0 Repeated and verified X2. (L) 150.0 - 400.0 K/uL    Comment: Large Platelets seen on smear.   Neutrophils Relative % 46.8 43.0 - 77.0 %   Lymphocytes Relative 34.1 12.0 - 46.0 %   Monocytes Relative 12.1 (H) 3.0 - 12.0 %   Eosinophils Relative 5.8 (H) 0.0 - 5.0 %   Basophils Relative 1.2 0.0 - 3.0 %   Neutro Abs 2.2 1.4 - 7.7 K/uL   Lymphs Abs 1.6 0.7 - 4.0 K/uL   Monocytes Absolute 0.6 0.1 - 1.0 K/uL   Eosinophils Absolute 0.3 0.0 - 0.7 K/uL   Basophils Absolute 0.1 0.0 - 0.1 K/uL  Comprehensive metabolic panel     Status: None   Collection Time: 10/26/17  9:36 AM  Result Value Ref Range   Sodium 137 135 - 145 mEq/L   Potassium 4.2 3.5 - 5.1 mEq/L   Chloride 101 96 - 112 mEq/L   CO2 31 19 - 32 mEq/L   Glucose, Bld 94 70 - 99 mg/dL   BUN 22 6 - 23 mg/dL   Creatinine, Ser 7.821.24 0.40 - 1.50 mg/dL   Total Bilirubin 0.9 0.2 - 1.2 mg/dL   Alkaline Phosphatase 63 39 - 117 U/L   AST 20 0 - 37 U/L   ALT 21 0 - 53 U/L   Total Protein 7.0 6.0 - 8.3 g/dL   Albumin 4.1 3.5 - 5.2 g/dL   Calcium 9.3 8.4 - 95.610.5 mg/dL   GFR 21.3069.44 >86.57>60.00 mL/min  CBC with Differential/Platelet     Status: None   Collection Time: 12/05/17 10:52 AM  Result Value Ref Range   WBC 4.8 4.0 - 10.5 K/uL   RBC 4.94 4.22 - 5.81 Mil/uL   Hemoglobin 15.7 13.0 - 17.0 g/dL   HCT 84.646.5  96.239.0 - 95.252.0 %   MCV 94.2 78.0 - 100.0 fl   MCHC 33.7 30.0 - 36.0 g/dL   RDW 84.112.8 32.411.5 - 40.115.5 %   Platelets 180.0 150.0 - 400.0 K/uL    Comment: Giant Platelets seen on smear.   Neutrophils Relative % 55.7 43.0 - 77.0 %   Lymphocytes  Relative 31.7 12.0 - 46.0 %   Monocytes Relative 8.8 3.0 - 12.0 %   Eosinophils Relative 2.4 0.0 - 5.0 %   Basophils Relative 1.4 0.0 - 3.0 %   Neutro Abs 2.7 1.4 - 7.7 K/uL   Lymphs Abs 1.5 0.7 - 4.0 K/uL   Monocytes Absolute 0.4 0.1 - 1.0 K/uL   Eosinophils Absolute 0.1 0.0 - 0.7 K/uL   Basophils Absolute 0.1 0.0 - 0.1 K/uL   Objective  Body mass index is 25.66 kg/m. Wt Readings from Last 3 Encounters:  12/28/17 159 lb (72.1 kg)  10/23/17 159 lb 3.2 oz (72.2 kg)   Temp Readings from Last 3 Encounters:  10/23/17 97.9 F (36.6 C) (Oral)   BP Readings from Last 3 Encounters:  12/28/17 130/70  10/23/17 126/66   Pulse Readings from Last 3 Encounters:  12/28/17 100  10/23/17 67    Physical Exam  Constitutional: He is oriented to person, place, and time. Vital signs are normal. He appears well-developed and well-nourished. He is cooperative.  HENT:  Head: Normocephalic and atraumatic.  Mouth/Throat: Oropharynx is clear and moist and mucous membranes are normal.  Eyes: Pupils are equal, round, and reactive to light. Conjunctivae are normal.  Cardiovascular: Normal rate, regular rhythm and normal heart sounds.  Pulmonary/Chest: Effort normal and breath sounds normal.  Neurological: He is alert and oriented to person, place, and time. Gait normal.  Skin: Skin is warm, dry and intact.  Psychiatric: He has a normal mood and affect. His speech is normal and behavior is normal. Judgment and thought content normal. Cognition and memory are normal.  Nursing note and vitals reviewed.   Assessment   1. Chronic neck and low back pain +C and L spine Xray mild to moderate narrowing and degenerative changes  2. ED 3. Hyperhidrosis  4. H/o asthma with  sob  5. HM Plan  1.  Hold PT now due to sch  Increase robaxin 750 mg qd prn  2. Check testosterone total and free  If normal refer urology  Hold on viagra for now per pt  3. Reviewed labs no current etiology  4. cxr today  rec allegra 12 hr Prn Albuterol inhaler  5.  Declines flu  UTD Tdap Hep B vaccine today    Congratulated on never a smoker  rec healthy diet choices  Declined STD testing   Provider: Dr. French Ana McLean-Scocuzza-Internal Medicine

## 2017-12-28 NOTE — Progress Notes (Signed)
Pre visit review using our clinic review tool, if applicable. No additional management support is needed unless otherwise documented below in the visit note. 

## 2018-01-02 ENCOUNTER — Other Ambulatory Visit: Payer: 59

## 2018-01-05 ENCOUNTER — Ambulatory Visit: Payer: 59 | Admitting: Internal Medicine

## 2018-01-30 ENCOUNTER — Ambulatory Visit: Payer: 59

## 2018-01-31 ENCOUNTER — Ambulatory Visit: Payer: 59

## 2018-04-03 ENCOUNTER — Ambulatory Visit: Payer: 59 | Admitting: Internal Medicine

## 2018-09-13 ENCOUNTER — Ambulatory Visit: Payer: 59 | Admitting: Internal Medicine

## 2018-10-03 ENCOUNTER — Ambulatory Visit: Payer: 59 | Admitting: Internal Medicine

## 2019-01-01 ENCOUNTER — Ambulatory Visit (INDEPENDENT_AMBULATORY_CARE_PROVIDER_SITE_OTHER): Payer: 59 | Admitting: Internal Medicine

## 2019-01-01 DIAGNOSIS — Z1389 Encounter for screening for other disorder: Secondary | ICD-10-CM

## 2019-01-01 DIAGNOSIS — M5030 Other cervical disc degeneration, unspecified cervical region: Secondary | ICD-10-CM

## 2019-01-01 DIAGNOSIS — E561 Deficiency of vitamin K: Secondary | ICD-10-CM

## 2019-01-01 DIAGNOSIS — M542 Cervicalgia: Secondary | ICD-10-CM

## 2019-01-01 DIAGNOSIS — B009 Herpesviral infection, unspecified: Secondary | ICD-10-CM | POA: Insufficient documentation

## 2019-01-01 DIAGNOSIS — Z0000 Encounter for general adult medical examination without abnormal findings: Secondary | ICD-10-CM

## 2019-01-01 DIAGNOSIS — Z1329 Encounter for screening for other suspected endocrine disorder: Secondary | ICD-10-CM

## 2019-01-01 DIAGNOSIS — J4520 Mild intermittent asthma, uncomplicated: Secondary | ICD-10-CM

## 2019-01-01 DIAGNOSIS — Z13220 Encounter for screening for lipoid disorders: Secondary | ICD-10-CM

## 2019-01-01 MED ORDER — OXYCODONE-ACETAMINOPHEN 5-325 MG PO TABS: 1.0000 | ORAL_TABLET | Freq: Two times a day (BID) | ORAL | 0 refills | Status: DC | PRN

## 2019-01-01 MED ORDER — ALBUTEROL SULFATE HFA 108 (90 BASE) MCG/ACT IN AERS: 1.0000 | INHALATION_SPRAY | Freq: Four times a day (QID) | RESPIRATORY_TRACT | 11 refills | Status: DC | PRN

## 2019-01-01 MED ORDER — METHOCARBAMOL 750 MG PO TABS
750.0000 mg | ORAL_TABLET | Freq: Two times a day (BID) | ORAL | 2 refills | Status: DC | PRN
Start: 2019-01-01 — End: 2023-07-26

## 2019-01-01 MED ORDER — VALACYCLOVIR HCL 500 MG PO TABS: 500.0000 mg | ORAL_TABLET | Freq: Two times a day (BID) | ORAL | 11 refills | Status: DC

## 2019-01-01 NOTE — Progress Notes (Signed)
telephone Note failed Doxynon   I connected with Chris Baker   on 01/01/19 at 11:40 AM EDT by telephone and verified that I am speaking with the correct person using two identifiers.  Location patient:car Location provider:work  Persons participating in the virtual visit: patient, provider, pts son in car   I discussed the limitations of evaluation and management by telemedicine and the availability of in person appointments. The patient expressed understanding and agreed to proceed.   HPI: 1. Chronic neck pain Xray 10/2017 abnormal feels like a crook in neck no heavy lifting pain 5/10-10/10 daily for the last 2 months tried otc topicals w/o relief and tried narcotic given by Dentist which helped with pain and wants refill. Denies heavy lifting, pain is not affected by weather He did 2 sessions of PT but was costly   2. ED improved after sleep improved and stress improved and changed to day shift from night    ROS: See pertinent positives and negatives per HPI. ED better   Past Medical History:  Diagnosis Date  . Asthma    childhood   . Back pain   . Chicken pox   . Herpes simplex infection   . Neck pain     Past Surgical History:  Procedure Laterality Date  . NO PAST SURGERIES      Family History  Problem Relation Age of Onset  . COPD Mother        never smoker   . Cancer Father        tongue cancer smoker   . Hypertension Father     SOCIAL HX: married with kids    Current Outpatient Medications:  .  albuterol (PROAIR HFA) 108 (90 Base) MCG/ACT inhaler, Inhale 1-2 puffs into the lungs every 6 (six) hours as needed for wheezing or shortness of breath., Disp: 1 Inhaler, Rfl: 11 .  methocarbamol (ROBAXIN) 750 MG tablet, Take 1 tablet (750 mg total) by mouth 2 (two) times daily as needed for muscle spasms., Disp: 60 tablet, Rfl: 2 .  oxyCODONE-acetaminophen (PERCOCET) 5-325 MG tablet, Take 1 tablet by mouth 2 (two) times daily as needed for severe pain. Rx 1/1, Disp:  10 tablet, Rfl: 0 .  valACYclovir (VALTREX) 500 MG tablet, Take 1 tablet (500 mg total) by mouth 2 (two) times daily. 3-7 days as needed outbreak, Disp: 60 tablet, Rfl: 11  EXAM:  VITALS per patient if applicable:  GENERAL: alert, oriented, appears well and in no acute distress  PSYCH/NEURO: pleasant and cooperative, no obvious depression or anxiety, speech and thought processing grossly intact  ASSESSMENT AND PLAN:  Discussed the following assessment and plan:   Neck pain, chronic - Plan: methocarbamol (ROBAXIN) 750 MG tablet, oxyCODONE-acetaminophen (PERCOCET) 5-325 MG tablet bid prn #10 no refills  MRI C spine ordered   Mild intermittent asthma, unspecified whether complicated - Plan: albuterol (PROAIR HFA) 108 (90 Base) MCG/ACT inhaler  HSV infection - Plan: valACYclovir (VALTREX) 500 MG tablet bid prn 3-7 days outbreak  HM sch fasting labs at f/u do physical Declines flu  UTD Tdap Hep B vaccine today  1/3 will need vaccine in future   Congratulated on never a smoker  rec healthy diet choices Declined STD testing    I discussed the assessment and treatment plan with the patient. The patient was provided an opportunity to ask questions and all were answered. The patient agreed with the plan and demonstrated an understanding of the instructions.   The patient was advised to call  back or seek an in-person evaluation if the symptoms worsen or if the condition fails to improve as anticipated.  Time  Spent 25 minutes Bevelyn Bucklesracy N McLean-Scocuzza, MD

## 2019-02-12 ENCOUNTER — Ambulatory Visit: Payer: 59

## 2019-05-23 ENCOUNTER — Ambulatory Visit
Admission: RE | Admit: 2019-05-23 | Discharge: 2019-05-23 | Disposition: A | Discharge status: Home / Self Care | Payer: 59 | Source: Ambulatory Visit | Attending: Internal Medicine | Admitting: Internal Medicine

## 2019-05-23 ENCOUNTER — Other Ambulatory Visit: Payer: Self-pay

## 2019-05-23 DIAGNOSIS — M5030 Other cervical disc degeneration, unspecified cervical region: Secondary | ICD-10-CM

## 2019-05-24 ENCOUNTER — Other Ambulatory Visit: Payer: Self-pay | Admitting: Internal Medicine

## 2019-05-24 DIAGNOSIS — R937 Abnormal findings on diagnostic imaging of other parts of musculoskeletal system: Secondary | ICD-10-CM

## 2019-05-24 DIAGNOSIS — M542 Cervicalgia: Secondary | ICD-10-CM

## 2019-05-29 ENCOUNTER — Telehealth: Payer: Self-pay | Admitting: Internal Medicine

## 2019-05-29 NOTE — Telephone Encounter (Signed)
Pt states that his MRI showed he had a bulging disk and he wants to know if PCP will RX any pain medications for him. Pt uses  [id number] [address], [place] (Phone) [phone] (Fax)

## 2019-05-30 ENCOUNTER — Other Ambulatory Visit: Payer: Self-pay | Admitting: Internal Medicine

## 2019-05-30 DIAGNOSIS — M542 Cervicalgia: Secondary | ICD-10-CM

## 2019-05-30 DIAGNOSIS — M5030 Other cervical disc degeneration, unspecified cervical region: Secondary | ICD-10-CM

## 2019-05-30 MED ORDER — OXYCODONE-ACETAMINOPHEN 5-325 MG PO TABS: 1.0000 | ORAL_TABLET | Freq: Two times a day (BID) | ORAL | 0 refills | Status: DC | PRN

## 2019-05-30 NOTE — Telephone Encounter (Signed)
Sent percocet 10 tablets  Further pain control with neurosurgery  No refills after this from PCP

## 2019-07-28 ENCOUNTER — Encounter (INDEPENDENT_AMBULATORY_CARE_PROVIDER_SITE_OTHER): Payer: 59 | Admitting: Internal Medicine

## 2019-07-28 DIAGNOSIS — G8929 Other chronic pain: Secondary | ICD-10-CM

## 2019-07-28 DIAGNOSIS — F419 Anxiety disorder, unspecified: Secondary | ICD-10-CM | POA: Diagnosis not present

## 2019-07-28 DIAGNOSIS — G4700 Insomnia, unspecified: Secondary | ICD-10-CM

## 2019-07-28 DIAGNOSIS — M542 Cervicalgia: Secondary | ICD-10-CM

## 2019-07-29 ENCOUNTER — Other Ambulatory Visit: Payer: Self-pay | Admitting: Internal Medicine

## 2019-07-29 DIAGNOSIS — G8929 Other chronic pain: Secondary | ICD-10-CM

## 2019-07-29 DIAGNOSIS — M542 Cervicalgia: Secondary | ICD-10-CM

## 2019-07-31 ENCOUNTER — Other Ambulatory Visit: Payer: Self-pay | Admitting: Internal Medicine

## 2019-07-31 DIAGNOSIS — G4700 Insomnia, unspecified: Secondary | ICD-10-CM

## 2019-07-31 DIAGNOSIS — F419 Anxiety disorder, unspecified: Secondary | ICD-10-CM

## 2019-07-31 DIAGNOSIS — F329 Major depressive disorder, single episode, unspecified: Secondary | ICD-10-CM

## 2019-07-31 MED ORDER — LORAZEPAM 0.5 MG PO TABS: 0.5000 mg | ORAL_TABLET | Freq: Every day | ORAL | 1 refills | Status: DC | PRN

## 2019-07-31 NOTE — Telephone Encounter (Signed)
Good evening I don'Baker want to be on any kind of anti depressant related.My dad and mom takes lorazepam for nerve problems it helps them sleep.Is there anything else you can recommend that's not antidepressant related?Yes I would like to be referred to see a therapist.   Non-Urgent Medical Question  Chris Baker, Chris Baker  You 23 hours ago (8:18 PM)     Good evening I don'Baker want to be on any kind of anti depressant related.My dad and mom takes lorazepam for nerve problems it helps them sleep.Is there anything else you can recommend that's not antidepressant related?Yes I would like to be referred to see a therapist.        You  Chris Baker, Chris Baker (6:44 PM)     Meds for mood and anxiety are trial and error   We have trazadone which is non addictive for sleep and helps you sleep and also may help with anxiety and mood -do you want to try this?   We also have non addictive medications for anxiety I.e celexa, paxil, zoloft, prozac, lexapro which help with anxiety and depression or Buspar which helps for anxiety alone but with trazadone may address both anxiety and mood    Does someone in your family blood relative have anxiety and depression? If they are on a medication which works for them it may work for you Let me know   There is also a natural supplement for sleep either melatonin 5-10 mg at night you can buy over the counter or L theanine 100-200 mg at night I.e nature made brand or stress relax brand on amazon or at whole foods L theanine alone or tranquil sleep which has L theanine, melatonin and serotonin it it  -this is a natural supplement and may help   I would recommend a non addictive option trazadone as needed for sleep plus zoloft or celexa or lexapro or effexor or cymbalta or other options listed and also for you try meditation, exercise and natural supplement over the counter (otc) listed above   Cymbalta may help with anxiety/depression and chronic pain    Consider also seeing our therapist virtually Chris Baker  -do you want a referral?  What of the options above would you like to try for medication? Research above meds and let me know ?          Chris Baker, Chris Baker  You Yesterday (6:48 AM)     Good morning I agree with the fee.Im not having anxiety and depression problems.I just need something to calm my nerves due to a lot of stress and pain with my neck        You  Chris Baker, Chris Baker 2 days ago     Since neck pain has been an issue for you referred to the pain clinic  They may want to try injections before giving you narcotics but that will be up to them   If we are addressing new issues we charge a my chart fee to address I am referencing your request for a "nerve pill".   Are you agreeable to a fee for addressing your issue via my chart ?   We having medications for sleep I.e Henderson Newcomer, belsombra which are addictive  We have trazadone which is non addictive for sleep and helps you sleep and also may help with anxiety if this what you mean by "nerve"  Are you having anxiety?  There is also a natural supplement for sleep either melatonin 5-10 mg  at night you can buy over the counter or L theanine 100-200 mg at night I.e nature made brand or stress relax brand on amazon or at whole foods L theanine alone or tranquil sleep which has L theanine, melatonin and serotonin it it   We also have non addictive medications for anxiety I.e celexa, paxil, zoloft, prozac which help with anxiety and depression   I would recommend a non addictive option trazadone and you try meditation or natural supplement over the counter (otc)  Consider also seeing our therapist virtually Chris Baker   What would you like to do and are you agreeable to a my chart fee since we discussed this new issues outside of an appointment?   TMS        Chris Baker, Chris Baker  You 2 days ago     Good evening can you refer me to the pain clinic.Also could you  perscibe me a nerve pill to help me sleep please? Thank you        You  Chris Baker, Chris Baker 2 days ago     I do not do chronic narcotics  If you are going to need this on a long term basis I will have to refer you to the pain clinic or following up with Dr. Lucy Chris neurosurgery for other non addictive forms of management of your pain   What would you like to do?  Referrals for pain clinic?, steroid injections? Or back to neurosurgery?   TMS        Chris Baker, Chris Baker routed conversation to You 2 days ago    Chris Baker, Chris Baker  You 3 days ago     Good evening I need a refill on my oxycodone          A/P  1. Chronic pain referred to pain clinic chronic neck pain and abnormal MRI  Has seen Dr. Adriana Simas NS in the past pt declines for now  Could consider Dr. Yves Dill disc with patient wanted pain clinic referral  2. Anxiety/insomnia -declines antidepressants wants to be on ativan like his family  -discuss this is addictive and not a long term option Pt agreeable to therapy as well   Pt agreeable to fee  Time spent 10 minutes  Dr. Lonie Peak

## 2020-03-04 ENCOUNTER — Other Ambulatory Visit: Payer: Self-pay | Admitting: Internal Medicine

## 2020-03-04 DIAGNOSIS — J4520 Mild intermittent asthma, uncomplicated: Secondary | ICD-10-CM

## 2020-03-04 MED ORDER — ALBUTEROL SULFATE HFA 108 (90 BASE) MCG/ACT IN AERS: 1.0000 | INHALATION_SPRAY | Freq: Four times a day (QID) | RESPIRATORY_TRACT | 2 refills | Status: DC | PRN

## 2020-04-27 ENCOUNTER — Telehealth: Payer: Self-pay | Admitting: Internal Medicine

## 2020-04-27 ENCOUNTER — Other Ambulatory Visit: Payer: Self-pay

## 2020-04-27 DIAGNOSIS — J4520 Mild intermittent asthma, uncomplicated: Secondary | ICD-10-CM

## 2020-04-27 MED ORDER — ALBUTEROL SULFATE HFA 108 (90 BASE) MCG/ACT IN AERS: 1.0000 | INHALATION_SPRAY | Freq: Four times a day (QID) | RESPIRATORY_TRACT | 2 refills | Status: DC | PRN

## 2020-04-27 NOTE — Telephone Encounter (Signed)
Call pt He needs VV asap either here ( if we have ) or he need to go urgent care if we dont have anything available today

## 2020-04-27 NOTE — Telephone Encounter (Signed)
Unable to leave message for patient to return call back. VM Box has not been set up.

## 2020-04-27 NOTE — Telephone Encounter (Signed)
Just FYI spoke to patient & he was actually now back at work. He was concerned of being winded with activity & tiredness. He also has asthma & has been using inhaler that he hadn't had to use in some time. He wanted to know if there was really anything that could be done. I told him that we could do a VV. I also explained that with Covid a lot of times these symptoms last fp awhile. I did refill inhaler & he will call back for appointment if needed.

## 2020-04-27 NOTE — Telephone Encounter (Signed)
Patient tested positive for COVID about 14 days ago. He still has a bad cough and issue with breathing, his inhaler is not helping for very long. Patient wanted to know what he can do.

## 2020-04-27 NOTE — Telephone Encounter (Signed)
noted 

## 2020-05-10 ENCOUNTER — Encounter: Payer: Self-pay | Admitting: Internal Medicine

## 2020-06-02 ENCOUNTER — Telehealth: Payer: Self-pay | Admitting: Internal Medicine

## 2020-06-02 NOTE — Telephone Encounter (Signed)
Patient called stated that he has poison ivy and it is spreading wanted to know if he could get something called in for it.

## 2020-06-02 NOTE — Telephone Encounter (Signed)
Please advise, does Patient need to be seen?  °

## 2020-06-03 ENCOUNTER — Telehealth (INDEPENDENT_AMBULATORY_CARE_PROVIDER_SITE_OTHER): Payer: 59 | Admitting: Internal Medicine

## 2020-06-03 DIAGNOSIS — L237 Allergic contact dermatitis due to plants, except food: Secondary | ICD-10-CM

## 2020-06-03 MED ORDER — CLOBETASOL PROPIONATE 0.05 % EX CREA: 1.0000 "application " | TOPICAL_CREAM | Freq: Two times a day (BID) | CUTANEOUS | 2 refills | Status: DC

## 2020-06-03 MED ORDER — HYDROXYZINE HCL 25 MG PO TABS: 25.0000 mg | ORAL_TABLET | Freq: Three times a day (TID) | ORAL | 0 refills | Status: DC | PRN

## 2020-06-03 MED ORDER — PREDNISONE 20 MG PO TABS: 40.0000 mg | ORAL_TABLET | Freq: Every day | ORAL | 0 refills | Status: DC

## 2020-06-03 NOTE — Telephone Encounter (Signed)
Get more details when where whats going on as far as sx's?   Is he agreeable telephone consult fee?   He can try otc benadryl or zyrtec/claritin/allegra for itching  Wash all clothes    What pharmacy?

## 2020-06-03 NOTE — Telephone Encounter (Signed)
[facility]s [facility]

## 2020-06-03 NOTE — Telephone Encounter (Signed)
No answer, no voicemail.

## 2020-06-03 NOTE — Telephone Encounter (Signed)
Patient calling back in. States he is agreeable to tele consult.   Patient states that he was doing some yard work 2-3 days ago and then the rash started. States it was on the insides of both arms. This has now spread around to his wrists and then to the stomach. Patient states the rash is itchy and starting to blister.   [facility]       Documentation   Tilford Pillar, CMA 3 hours ago (2:20 PM)     No answer, no voicemail.       Documentation    You  Tilford Pillar, CMA 7 hours ago (10:35 AM)  TM   Get more details when where whats going on as far as sx's?   Is he agreeable telephone consult fee?   He can try otc benadryl or zyrtec/claritin/allegra for itching  Wash all clothes    What pharmacy?          Documentation   Ninfa Meeker H routed conversation to You 7 hours ago (10:30 AM)  Dutch Quint, Bridgett H 7 hours ago (10:30 AM)  BP   Pt is following up on his request?       Documentation   Laurens, Matheny, Bridgett H 7 hours ago (10:28 AM)  Tilford Pillar, CMA  You Yesterday (5:18 PM)     Please advise, does Patient need to be seen?       Documentation   Grier Mitts routed conversation to Tilford Pillar, CMA Yesterday (4:19 PM)  Fatima Sanger (4:19 PM)  IJ   Patient called stated that he has poison ivy and it is spreading wanted to know if he could get something called in for it.      Documentation    Poison ivy  Rx. Clobetasol  Rx atarax  Rx prednisone  rtc if not better  otc AH  Pt agreeable to telephone fee  Time 5-10 min Dr. French Ana McLean-Scocuzza

## 2020-06-03 NOTE — Telephone Encounter (Signed)
Pt is following up on his request?

## 2020-06-03 NOTE — Telephone Encounter (Signed)
Patient calling back in. States he is agreeable to tele consult.   Patient states that he was doing some yard work 2-3 days ago and then the rash started. States it was on the insides of both arms. This has now spread around to his wrists and then to the stomach. Patient states the rash is itchy and starting to blister.   [facility]

## 2020-06-04 NOTE — Telephone Encounter (Signed)
Patient informed and verbalized understanding

## 2020-12-21 IMAGING — MR MR CERVICAL SPINE W/O CM
5 series · 39 of 48 positions shown · non-contrast
Comparison: Cervical spine radiographs 10/23/2017

CLINICAL DATA: Abnormal x-ray. Cervical disc degeneration. Chronic
neck pain and stiffness. Headaches. Worsening symptoms.

EXAM:
MRI CERVICAL SPINE WITHOUT CONTRAST
TECHNIQUE: Multiplanar, multisequence MR imaging of the cervical spine was
performed. No intravenous contrast was administered.

[Series 5: T2 · sagittal · 3.0mm · 0.62mm/px · 7 of 15 slices shown (1 of 2)]
[im 1/15]
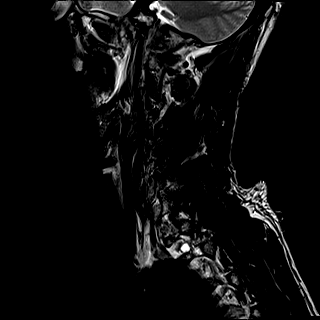
[im 3/15]
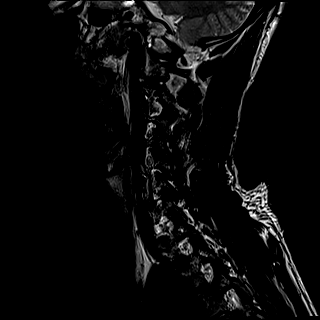
[im 5/15]
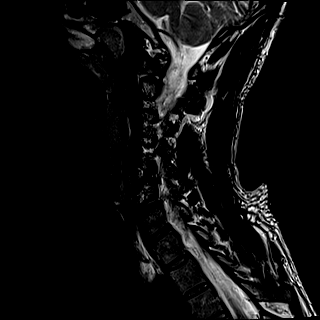
[im 8/15]
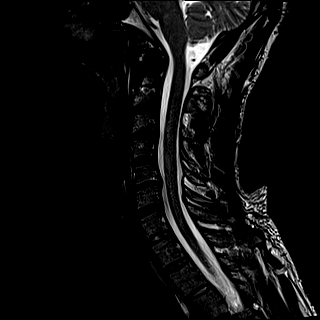
[im 10/15]
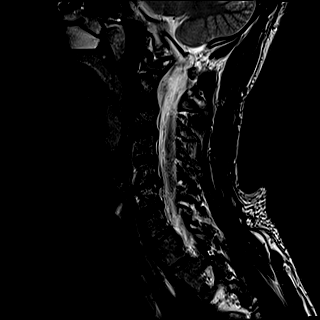
[im 12/15]
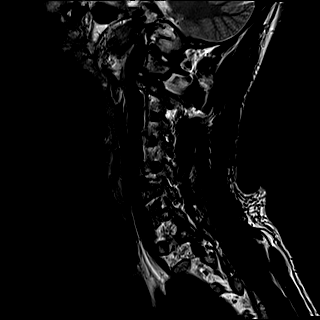
[im 15/15]
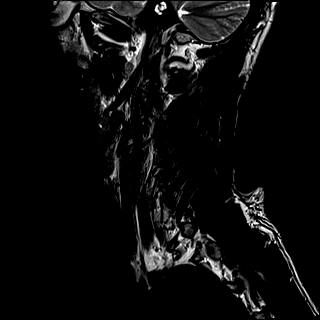

[Series 6: FLAIR · sagittal · 3.0mm · 0.78mm/px · 7 of 15 slices shown]
[im 1/15]
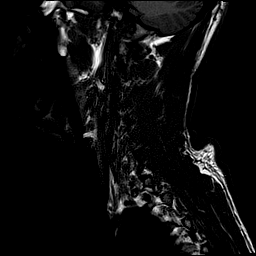
[im 3/15]
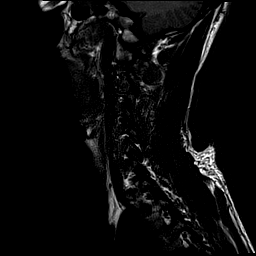
[im 5/15]
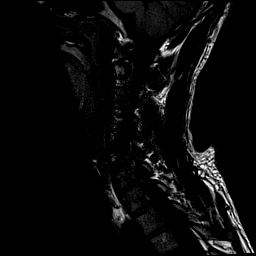
[im 8/15]
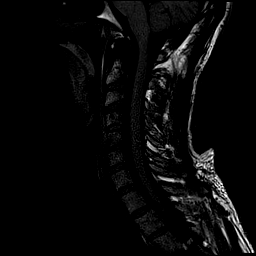
[im 10/15]
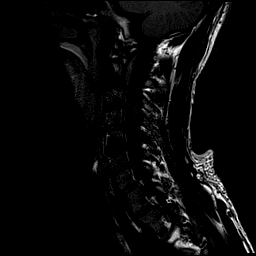
[im 12/15]
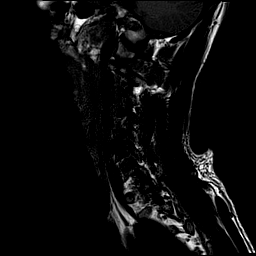
[im 15/15]
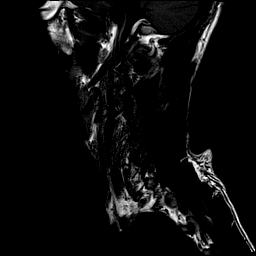

[Series 7: STIR · sagittal · 3.0mm · 0.62mm/px · 7 of 15 slices shown]
[im 1/15]
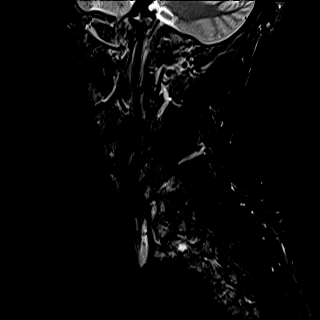
[im 3/15]
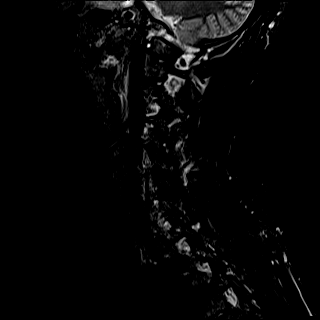
[im 5/15]
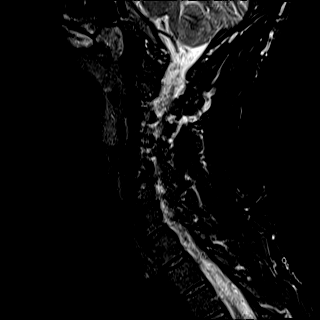
[im 8/15]
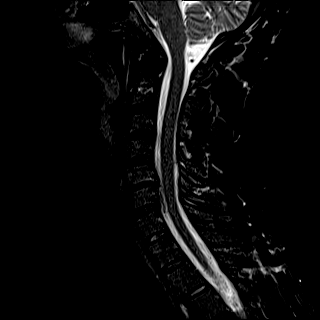
[im 10/15]
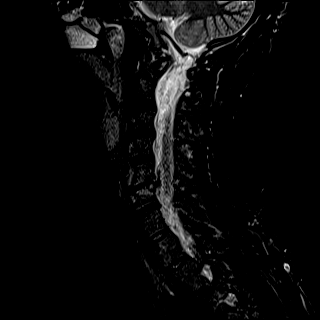
[im 12/15]
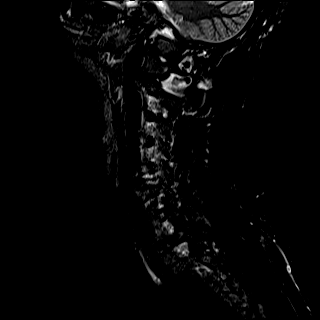
[im 15/15]
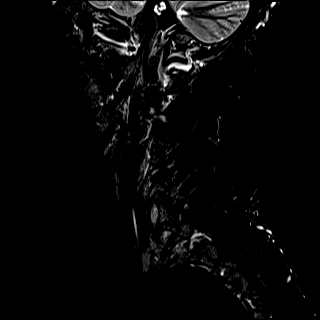

[Series 8: T2 · axial · 3.0mm · 0.70mm/px · z∈[-80,+16]mm · 10 of 28 slices shown (2 of 2)]
[im 1/28]
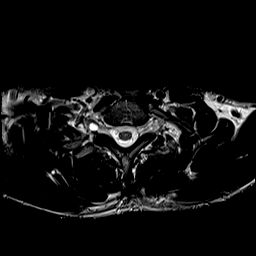
[im 3/28]
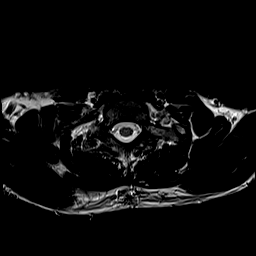
[im 5/28]
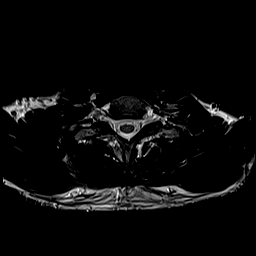
[im 10/28]
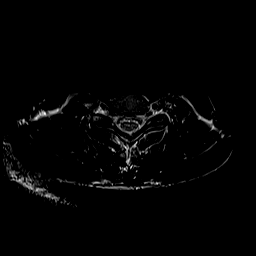
[im 12/28]
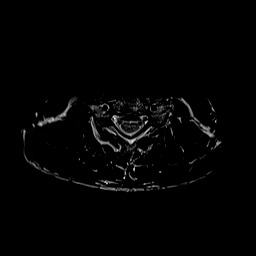
[im 14/28]
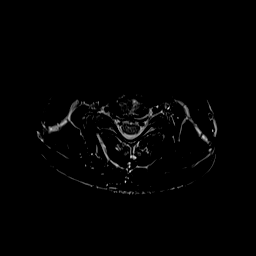
[im 16/28]
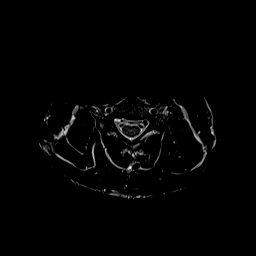
[im 19/28]
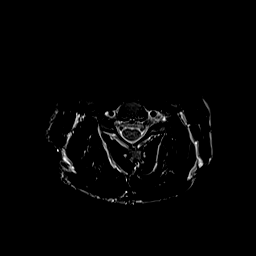
[im 23/28]
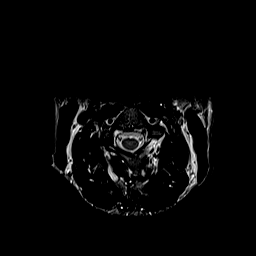
[im 28/28]
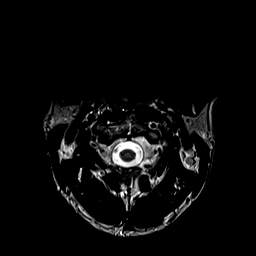

[Series 9: ax mpgr · axial · 3.0mm · 0.35mm/px · z∈[-80,+16]mm · 8 of 29 slices shown]
[im 1/29]
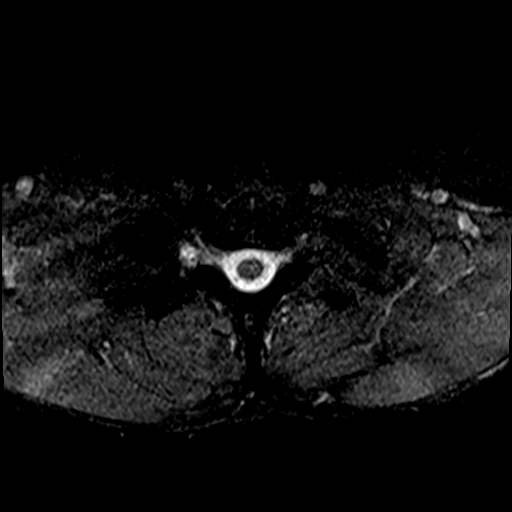
[im 5/29]
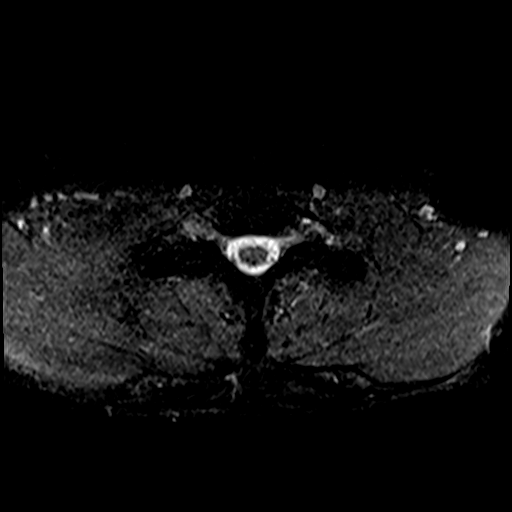
[im 9/29]
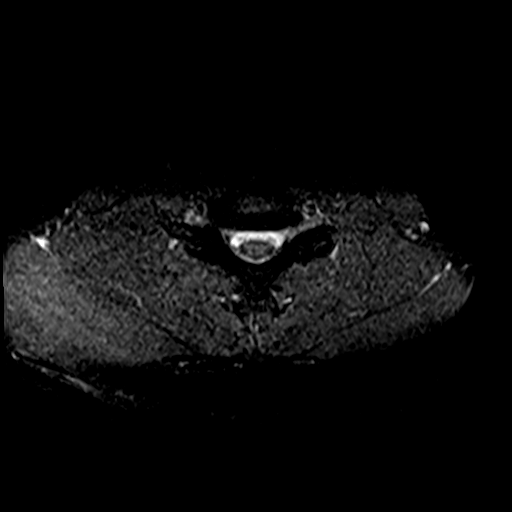
[im 13/29]
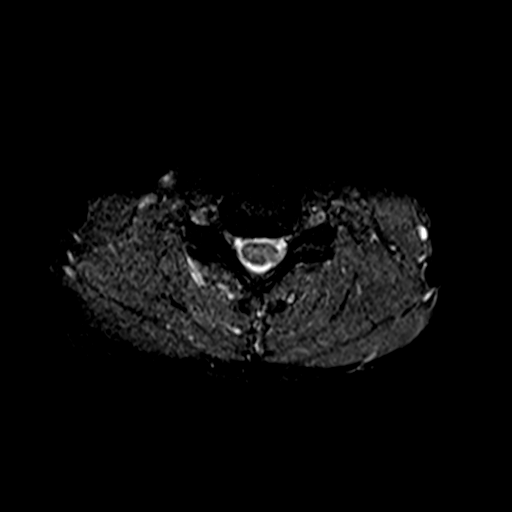
[im 16/29]
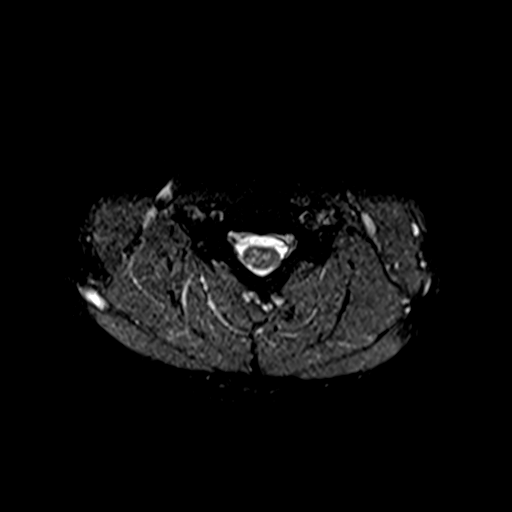
[im 20/29]
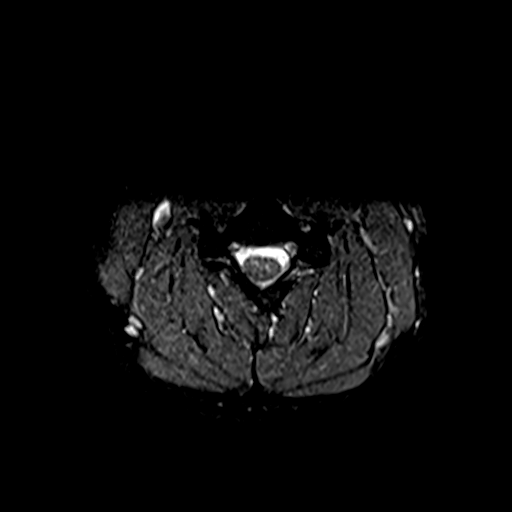
[im 24/29]
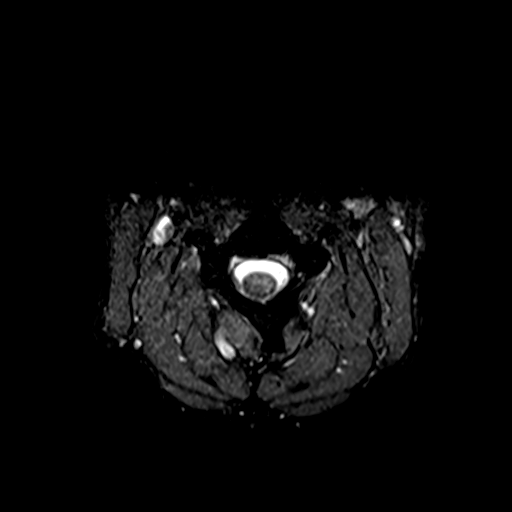
[im 29/29]
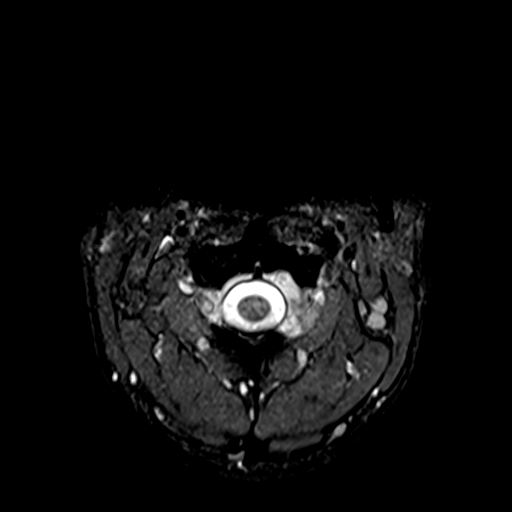

[39 of 48 positions shown; findings below may reference images not displayed]

FINDINGS: Alignment: Minimal retrolisthesis of C5 on C6.

Vertebrae: No fracture or suspicious osseous lesion. Moderate to
severe disc space narrowing at C5-6 with moderate degenerative
endplate marrow changes including edema.

Cord: Normal signal and morphology.

Posterior Fossa, vertebral arteries, paraspinal tissues:
Unremarkable.

Disc levels:

C2-3: At most minimal disc bulging and minimal uncovertebral
spurring without stenosis.

C3-4: Minimal disc bulging and mild uncovertebral spurring without
stenosis.

C4-5: Mild right facet arthrosis without disc herniation or
stenosis.

C5-6: Broad-based posterior disc osteophyte complex results in
moderate bilateral neural foraminal stenosis without spinal
stenosis.

C6-7: Uncovertebral spurring and minimal disc bulging result in
moderate right and borderline to mild left neural foraminal stenosis
without spinal stenosis.

C7-T1: Negative.
IMPRESSION: 1. Focally advanced disc degeneration at C5-6 with moderate
bilateral neural foraminal stenosis.
2. Moderate right neural foraminal stenosis at C6-7.
3. No spinal stenosis.

## 2023-07-26 ENCOUNTER — Ambulatory Visit: Payer: Self-pay | Admitting: Family Medicine

## 2023-07-26 ENCOUNTER — Encounter: Payer: Self-pay | Admitting: Family Medicine

## 2023-07-26 VITALS — BP 122/74 | HR 73 | Temp 98.0°F | Resp 16 | Ht 66.0 in | Wt 156.1 lb

## 2023-07-26 DIAGNOSIS — L608 Other nail disorders: Secondary | ICD-10-CM

## 2023-07-26 DIAGNOSIS — E559 Vitamin D deficiency, unspecified: Secondary | ICD-10-CM

## 2023-07-26 DIAGNOSIS — R7309 Other abnormal glucose: Secondary | ICD-10-CM

## 2023-07-26 DIAGNOSIS — E538 Deficiency of other specified B group vitamins: Secondary | ICD-10-CM

## 2023-07-26 DIAGNOSIS — R809 Proteinuria, unspecified: Secondary | ICD-10-CM

## 2023-07-26 DIAGNOSIS — Z13220 Encounter for screening for lipoid disorders: Secondary | ICD-10-CM

## 2023-07-26 DIAGNOSIS — B009 Herpesviral infection, unspecified: Secondary | ICD-10-CM

## 2023-07-26 DIAGNOSIS — Z789 Other specified health status: Secondary | ICD-10-CM

## 2023-07-26 DIAGNOSIS — J4520 Mild intermittent asthma, uncomplicated: Secondary | ICD-10-CM

## 2023-07-26 LAB — POCT URINALYSIS DIP (CLINITEK)
Bilirubin, UA: NEGATIVE
Blood, UA: NEGATIVE
Glucose, UA: NEGATIVE mg/dL
Ketones, POC UA: NEGATIVE mg/dL
Leukocytes, UA: NEGATIVE
Nitrite, UA: NEGATIVE
POC PROTEIN,UA: NEGATIVE
Spec Grav, UA: <=1.005 — AB (ref 1.010–1.025)
Urobilinogen, UA: 0.2 U/dL
pH, UA: 5.5 (ref 5.0–8.0)

## 2023-07-26 NOTE — Patient Instructions (Addendum)
It was a pleasure meeting you today. Thank you for allowing me to take part in your health care.  Our goals for today as we discussed include:  Schedule appointment for blood work.  Fast for 10 hours   This is a list of the screening recommended for you and due dates:  Health Maintenance  Topic Date Due   HIV Screening  Never done   Hepatitis C Screening  Never done   COVID-19 Vaccine (1 - 2023-24 season) Never done   Flu Shot  12/04/2023*   DTaP/Tdap/Td vaccine (5 - Td or Tdap) 10/24/2027   HPV Vaccine  Aged Out  *Topic was postponed. The date shown is not the original due date.    Follow up as needed  If you have any questions or concerns, please do not hesitate to call the office at [phone].  I look forward to our next visit and until then take care and stay safe.  Regards,   Dana Allan, MD   [facility]

## 2023-07-26 NOTE — Progress Notes (Signed)
SUBJECTIVE:   Chief Complaint  Patient presents with   Establish Care   HPI Presents to clinic to establish care  Discussed the use of AI scribe software for clinical note transcription with the patient, who gave verbal consent to proceed.  History of Present Illness The patient, a middle-aged individual, presents to establish care after a significant period without medical follow-up. He expresses a desire for a comprehensive physical examination and assessment of his current health status. The patient denies any significant medical issues and has not had any recent blood work done. He reports a history of childhood asthma, which was managed with an inhaler, but he has not experienced any flare-ups or significant shortness of breath in recent years.  The patient also reports a history of sinus surgery during childhood, but details are vague due to the patient's young age at the time of the procedure. He denies any significant family history of medical conditions such as high blood pressure, thyroid issues, cancer, diabetes, or high cholesterol. The patient denies any personal history of smoking or illicit drug use, but admits to occasional alcohol consumption.  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 He reports no significant occupational health concerns. He also mentions a concern about a nail abnormality, specifically an indentation on one fingernail, which has been present for several years.  The patient also reports a previous diagnosis of proteinuria during a [employer] [facility] physical examination approximately a year ago. He expresses concern about this finding and requests further investigation. Additionally, he mentions a previous episode of a staph infection following a cut on his finger, which was managed in the emergency department several years ago.  Lastly, the patient reports occasional episodes  of an itchy rash that appears to form blisters, which resolve after a few days. The rash has been occurring for several years, and the patient is unsure of the cause. He expresses interest in further evaluation of this issue.    PERTINENT PMH / PSH: As above  OBJECTIVE:  BP 122/74   Pulse 73   Temp 98 F (36.7 C)   Resp 16   Ht 5\' 6"  (1.676 m)   Wt 156 lb 2 oz (70.8 kg)   SpO2 100%   BMI 25.20 kg/m    Physical Exam Vitals reviewed.  HENT:     Head: Normocephalic.     Right Ear: Tympanic membrane, ear canal and external ear normal.     Left Ear: Tympanic membrane, ear canal and external ear normal.     Nose: Nose normal.     Mouth/Throat:     Mouth: Mucous membranes are moist.  Eyes:     Conjunctiva/sclera: Conjunctivae normal.     Pupils: Pupils are equal, round, and reactive to light.  Neck:     Thyroid: No thyromegaly or thyroid tenderness.     Vascular: No carotid bruit.  Cardiovascular:     Rate and Rhythm: Normal rate and regular rhythm.     Pulses: Normal pulses.     Heart sounds: Normal heart sounds.  Pulmonary:     Effort: Pulmonary effort is normal.     Breath sounds: Normal breath sounds.  Abdominal:     General: Abdomen is flat. Bowel sounds are normal.     Palpations: Abdomen is soft.  Musculoskeletal:        General: Normal range of motion.     Cervical back: Normal range of motion  and neck supple.     Right lower leg: No edema.     Left lower leg: No edema.  Lymphadenopathy:     Cervical: No cervical adenopathy.  Skin:    Comments: Left great thumb nail pitting  Neurological:     Mental Status: He is alert.  Psychiatric:        Mood and Affect: Mood normal.        Behavior: Behavior normal.        Thought Content: Thought content normal.        Judgment: Judgment normal.        07/26/2023    9:11 AM 12/28/2017   10:14 AM 10/23/2017    2:17 PM  Depression screen PHQ 2/9  Decreased Interest 0 0 0  Down, Depressed, Hopeless 0 0 0  PHQ - 2  Score 0 0 0  Altered sleeping 0    Tired, decreased energy 0    Change in appetite 0    Feeling bad or failure about yourself  0    Trouble concentrating 0    Moving slowly or fidgety/restless 0    Suicidal thoughts 0    PHQ-9 Score 0    Difficult doing work/chores Not difficult at all        07/26/2023    9:11 AM  GAD 7 : Generalized Anxiety Score  Nervous, Anxious, on Edge 0  Control/stop worrying 0  Worry too much - different things 0  Trouble relaxing 0  Restless 0  Easily annoyed or irritable 0  Afraid - awful might happen 0  Total GAD 7 Score 0  Anxiety Difficulty Not difficult at all    ASSESSMENT/PLAN:  Proteinuria, unspecified type Assessment & Plan: History of protein in urine noted on previous urinalysis at [facility] approx year ago. Was recommended to have reevaluated. -Collect urine sample for analysis.  Orders: -     POCT URINALYSIS DIP (CLINITEK)  Lipid screening -     Lipid panel; Future  Vitamin D deficiency -     VITAMIN D 25 Hydroxy (Vit-D Deficiency, Fractures); Future  Vitamin B 12 deficiency -     Vitamin B12; Future  Abnormal glucose -     Hemoglobin A1c; Future  Nail pitting Assessment & Plan: Longstanding changes in one nail, for 18 years, possibly related to mineral deficiency or fungal infection. -Check labs for possible deficiencies, autoimmune disorder -Consider dermatology referral if labs are normal.  Orders: -     Comprehensive metabolic panel; Future -     CBC with Differential/Platelet; Future -     TSH; Future -     Sedimentation rate; Future -     ANA; Future -     C-reactive protein; Future  Not immune to hepatitis B virus Assessment & Plan: Previous level in 2019 showed no immunity Unknown if received updated vaccination Recheck Hep B immunity  Orders: -     Hepatitis B surface antibody,qualitative; Future  Mild intermittent asthma without complication Assessment & Plan: Diagnosed in childhood.   No recent  exacerbations or need for albuterol inhaler. -No changes to current management.   HSV infection Assessment & Plan: Recurrent itchy skin lesions, possibly vesicular, lasting 3-4 days. History of cold sores.  Difficult to visualize from photo and no current outbreak today. -Advise patient to present for evaluation during next flare-up. -Consider herpes simplex virus (HSV) as a possible cause      General Health Maintenance -Schedule colonoscopy at age 47. -Declined HIV  and Hepatitis C screening.   PDMP reviewed  Return if symptoms worsen or fail to improve, for PCP.  Dana Allan, MD

## 2023-08-02 ENCOUNTER — Other Ambulatory Visit (INDEPENDENT_AMBULATORY_CARE_PROVIDER_SITE_OTHER): Payer: BC Managed Care – PPO

## 2023-08-02 ENCOUNTER — Telehealth: Payer: Self-pay | Admitting: Family Medicine

## 2023-08-02 DIAGNOSIS — E559 Vitamin D deficiency, unspecified: Secondary | ICD-10-CM | POA: Diagnosis not present

## 2023-08-02 DIAGNOSIS — R7309 Other abnormal glucose: Secondary | ICD-10-CM

## 2023-08-02 DIAGNOSIS — L608 Other nail disorders: Secondary | ICD-10-CM

## 2023-08-02 DIAGNOSIS — Z13220 Encounter for screening for lipoid disorders: Secondary | ICD-10-CM

## 2023-08-02 DIAGNOSIS — E538 Deficiency of other specified B group vitamins: Secondary | ICD-10-CM

## 2023-08-02 DIAGNOSIS — Z789 Other specified health status: Secondary | ICD-10-CM

## 2023-08-02 LAB — C-REACTIVE PROTEIN: CRP: <1 mg/dL (ref 0.5–20.0)

## 2023-08-02 LAB — CBC WITH DIFFERENTIAL/PLATELET
Basophils Absolute: 0 10*3/uL (ref 0.0–0.1)
Basophils Relative: 1.1 % (ref 0.0–3.0)
Eosinophils Absolute: 0.1 10*3/uL (ref 0.0–0.7)
Eosinophils Relative: 3.1 % (ref 0.0–5.0)
HCT: 45.8 % (ref 39.0–52.0)
Hemoglobin: 15.5 g/dL (ref 13.0–17.0)
Lymphocytes Relative: 45.3 % (ref 12.0–46.0)
Lymphs Abs: 2 10*3/uL (ref 0.7–4.0)
MCHC: 33.9 g/dL (ref 30.0–36.0)
MCV: 92.5 fL (ref 78.0–100.0)
Monocytes Absolute: 0.4 10*3/uL (ref 0.1–1.0)
Monocytes Relative: 9.7 % (ref 3.0–12.0)
Neutro Abs: 1.8 10*3/uL (ref 1.4–7.7)
Neutrophils Relative %: 40.8 % — ABNORMAL LOW (ref 43.0–77.0)
Platelets: 109 10*3/uL — ABNORMAL LOW (ref 150.0–400.0)
RBC: 4.95 Mil/uL (ref 4.22–5.81)
RDW: 12.7 % (ref 11.5–15.5)
WBC: 4.3 10*3/uL (ref 4.0–10.5)

## 2023-08-02 LAB — LIPID PANEL
Cholesterol: 151 mg/dL (ref 0–200)
HDL: 38 mg/dL — ABNORMAL LOW (ref >39.00)
LDL Cholesterol: 95 mg/dL (ref 0–99)
NonHDL: 113.35
Total CHOL/HDL Ratio: 4
Triglycerides: 94 mg/dL (ref 0.0–149.0)
VLDL: 18.8 mg/dL (ref 0.0–40.0)

## 2023-08-02 LAB — COMPREHENSIVE METABOLIC PANEL
ALT: 12 U/L (ref 0–53)
AST: 13 U/L (ref 0–37)
Albumin: 4.2 g/dL (ref 3.5–5.2)
Alkaline Phosphatase: 57 U/L (ref 39–117)
BUN: 20 mg/dL (ref 6–23)
CO2: 32 meq/L (ref 19–32)
Calcium: 9.3 mg/dL (ref 8.4–10.5)
Chloride: 102 meq/L (ref 96–112)
Creatinine, Ser: 1.27 mg/dL (ref 0.40–1.50)
GFR: 69.19 mL/min (ref >60.00)
Glucose, Bld: 96 mg/dL (ref 70–99)
Potassium: 4.6 meq/L (ref 3.5–5.1)
Sodium: 139 meq/L (ref 135–145)
Total Bilirubin: 0.5 mg/dL (ref 0.2–1.2)
Total Protein: 6.4 g/dL (ref 6.0–8.3)

## 2023-08-02 LAB — TSH: TSH: 3.31 u[IU]/mL (ref 0.35–5.50)

## 2023-08-02 LAB — VITAMIN D 25 HYDROXY (VIT D DEFICIENCY, FRACTURES): VITD: 32.62 ng/mL (ref 30.00–100.00)

## 2023-08-02 LAB — SEDIMENTATION RATE: Sed Rate: 5 mm/h (ref 0–15)

## 2023-08-02 LAB — VITAMIN B12: Vitamin B-12: 712 pg/mL (ref 211–911)

## 2023-08-02 LAB — HEMOGLOBIN A1C: Hgb A1c MFr Bld: 5.8 % (ref 4.6–6.5)

## 2023-08-02 NOTE — Telephone Encounter (Signed)
Pt would like to be called regarding urine results

## 2023-08-04 ENCOUNTER — Encounter: Payer: Self-pay | Admitting: Family Medicine

## 2023-08-04 ENCOUNTER — Other Ambulatory Visit: Payer: Self-pay | Admitting: Family Medicine

## 2023-08-04 DIAGNOSIS — D696 Thrombocytopenia, unspecified: Secondary | ICD-10-CM

## 2023-08-04 DIAGNOSIS — E559 Vitamin D deficiency, unspecified: Secondary | ICD-10-CM | POA: Insufficient documentation

## 2023-08-04 DIAGNOSIS — Z13220 Encounter for screening for lipoid disorders: Secondary | ICD-10-CM | POA: Insufficient documentation

## 2023-08-04 DIAGNOSIS — E538 Deficiency of other specified B group vitamins: Secondary | ICD-10-CM | POA: Insufficient documentation

## 2023-08-04 DIAGNOSIS — R7309 Other abnormal glucose: Secondary | ICD-10-CM | POA: Insufficient documentation

## 2023-08-04 DIAGNOSIS — Z789 Other specified health status: Secondary | ICD-10-CM | POA: Insufficient documentation

## 2023-08-04 DIAGNOSIS — R809 Proteinuria, unspecified: Secondary | ICD-10-CM | POA: Insufficient documentation

## 2023-08-04 DIAGNOSIS — L608 Other nail disorders: Secondary | ICD-10-CM | POA: Insufficient documentation

## 2023-08-04 NOTE — Assessment & Plan Note (Addendum)
Previous level in 2019 showed no immunity Unknown if received updated vaccination Recheck Hep B immunity

## 2023-08-04 NOTE — Assessment & Plan Note (Signed)
Diagnosed in childhood.   No recent exacerbations or need for albuterol inhaler. -No changes to current management.

## 2023-08-04 NOTE — Assessment & Plan Note (Signed)
Recurrent itchy skin lesions, possibly vesicular, lasting 3-4 days. History of cold sores.  Difficult to visualize from photo and no current outbreak today. -Advise patient to present for evaluation during next flare-up. -Consider herpes simplex virus (HSV) as a possible cause

## 2023-08-04 NOTE — Assessment & Plan Note (Signed)
Longstanding changes in one nail, for 18 years, possibly related to mineral deficiency or fungal infection. -Check labs for possible deficiencies, autoimmune disorder -Consider dermatology referral if labs are normal.

## 2023-08-04 NOTE — Assessment & Plan Note (Signed)
History of protein in urine noted on previous urinalysis at [facility] approx year ago. Was recommended to have reevaluated. -Collect urine sample for analysis.

## 2023-08-05 LAB — TEST AUTHORIZATION

## 2023-08-05 LAB — HEPATITIS B SURFACE ANTIBODY, QUANTITATIVE: Hep B S AB Quant (Post): <5 m[IU]/mL — ABNORMAL LOW (ref >=10)

## 2023-08-05 LAB — HEPATITIS B SURFACE ANTIBODY,QUALITATIVE: Hep B S Ab: NONREACTIVE

## 2023-08-05 LAB — ANA: Anti Nuclear Antibody (ANA): NEGATIVE

## 2023-08-09 ENCOUNTER — Encounter: Payer: Self-pay | Admitting: Family Medicine

## 2023-09-08 ENCOUNTER — Other Ambulatory Visit: Payer: Self-pay | Admitting: Family Medicine

## 2023-09-08 ENCOUNTER — Telehealth: Payer: Self-pay

## 2023-09-08 DIAGNOSIS — R7303 Prediabetes: Secondary | ICD-10-CM

## 2023-09-08 NOTE — Telephone Encounter (Signed)
 Copied from CRM [id number]. Topic: Clinical - Request for Lab/Test Order >> Sep 08, 2023  8:27 AM Chris Baker wrote: Reason for CRM: Patient scheduled for labs on 2/20, patient wanted to confirm all the test needed are ordered.

## 2023-09-11 NOTE — Telephone Encounter (Signed)
 Called pt and informed him of this message. He said he wants to get it checked since he has done a 360 with eating habits, and will have it done in Feb. He stated that he understood that the insurance might not cover it.

## 2023-10-25 ENCOUNTER — Telehealth: Payer: Self-pay | Admitting: Family Medicine

## 2023-10-25 ENCOUNTER — Encounter: Payer: Self-pay | Admitting: *Deleted

## 2023-10-25 NOTE — Telephone Encounter (Signed)
Left message asking the patient to call the office to reschedule his lab appointment from 10/26/23

## 2023-10-26 ENCOUNTER — Other Ambulatory Visit: Payer: BC Managed Care – PPO

## 2023-11-01 ENCOUNTER — Other Ambulatory Visit: Payer: BC Managed Care – PPO

## 2023-11-08 ENCOUNTER — Other Ambulatory Visit (INDEPENDENT_AMBULATORY_CARE_PROVIDER_SITE_OTHER): Payer: BC Managed Care – PPO

## 2023-11-08 DIAGNOSIS — D696 Thrombocytopenia, unspecified: Secondary | ICD-10-CM

## 2023-11-08 DIAGNOSIS — R7303 Prediabetes: Secondary | ICD-10-CM

## 2023-11-08 LAB — CBC WITH DIFFERENTIAL/PLATELET
Absolute Lymphocytes: 2332 {cells}/uL (ref 850–3900)
Absolute Monocytes: 611 {cells}/uL (ref 200–950)
Basophils Absolute: 72 {cells}/uL (ref 0–200)
Basophils Relative: 1.3 %
Eosinophils Absolute: 83 {cells}/uL (ref 15–500)
Eosinophils Relative: 1.5 %
HCT: 45.2 % (ref 38.5–50.0)
Hemoglobin: 14.8 g/dL (ref 13.2–17.1)
MCH: 30 pg (ref 27.0–33.0)
MCHC: 32.7 g/dL (ref 32.0–36.0)
MCV: 91.5 fL (ref 80.0–100.0)
Monocytes Relative: 11.1 %
Neutro Abs: 2404 {cells}/uL (ref 1500–7800)
Neutrophils Relative %: 43.7 %
Platelets: 139 10*3/uL — ABNORMAL LOW (ref 140–400)
RBC: 4.94 10*6/uL (ref 4.20–5.80)
RDW: 12.3 % (ref 11.0–15.0)
Total Lymphocyte: 42.4 %
WBC: 5.5 10*3/uL (ref 3.8–10.8)

## 2023-11-08 LAB — HEMOGLOBIN A1C: Hgb A1c MFr Bld: 5.7 % (ref 4.6–6.5)

## 2023-11-08 NOTE — Addendum Note (Signed)
 Addended by: Jarvis Morgan D on: 11/08/2023 10:40 AM   Modules accepted: Orders

## 2023-11-15 ENCOUNTER — Telehealth: Payer: Self-pay

## 2023-11-15 NOTE — Telephone Encounter (Signed)
 Copied from CRM [id number]. Topic: Clinical - Lab/Test Results >> Nov 15, 2023  8:26 AM Josefa Half C wrote: Reason for CRM: patient has requested for provider to do a follow up call discusing his lab results; please follow up with patient [phone]

## 2023-11-16 NOTE — Telephone Encounter (Signed)
 Copied from CRM [id number]. Topic: Clinical - Lab/Test Results >> Nov 16, 2023  8:01 AM Pascal Lux wrote: Reason for CRM: Patient called requesting a call back regarding lab results.

## 2023-11-17 NOTE — Telephone Encounter (Unsigned)
 Copied from CRM [id number]. Topic: Clinical - Lab/Test Results >> Nov 17, 2023  9:51 AM Orinda Kenner C wrote: Reason for CRM: Patient [phone] was advised to make an appointment to discuss lab results. Scheduled for 11/22/23 at 7:40 am.  Patient is leaving out of town today and wants to make sure labs are not to be so concerned about, patient is worried and wants peace of mind does not want to worry all week. If patient needs to take PTO, he will do it to address the test results. Please call back today.

## 2023-11-22 ENCOUNTER — Ambulatory Visit: Admitting: Family Medicine

## 2024-09-06 ENCOUNTER — Telehealth: Payer: Self-pay

## 2024-09-06 NOTE — Telephone Encounter (Signed)
 Copied from CRM [id number]. Topic: Appointments - Transfer of Care >> Sep 06, 2024  8:22 AM Antony RAMAN wrote: Pt is requesting to transfer FROM: no pcp Pt is requesting to transfer TO: bableen kaur Reason for requested transfer: pt request It is the responsibility of the team the patient would like to transfer to (Dr. vincente) to reach out to the patient if for any reason this transfer is not acceptable.

## 2024-09-06 NOTE — Telephone Encounter (Signed)
 Ok with TOC.  Jenette Aurora, DNP, AGNP-C 09/06/2024 2:24 PM

## 2024-10-10 ENCOUNTER — Ambulatory Visit: Admitting: General Practice

## 2024-10-10 ENCOUNTER — Encounter: Payer: Self-pay | Admitting: General Practice

## 2024-10-10 ENCOUNTER — Ambulatory Visit: Payer: Self-pay | Admitting: General Practice

## 2024-10-10 VITALS — BP 120/80 | HR 83 | Temp 98.9°F | Ht 67.5 in | Wt 149.0 lb

## 2024-10-10 DIAGNOSIS — L608 Other nail disorders: Secondary | ICD-10-CM

## 2024-10-10 DIAGNOSIS — R7303 Prediabetes: Secondary | ICD-10-CM | POA: Insufficient documentation

## 2024-10-10 DIAGNOSIS — R42 Dizziness and giddiness: Secondary | ICD-10-CM | POA: Insufficient documentation

## 2024-10-10 DIAGNOSIS — Z7689 Persons encountering health services in other specified circumstances: Secondary | ICD-10-CM | POA: Insufficient documentation

## 2024-10-10 DIAGNOSIS — D696 Thrombocytopenia, unspecified: Secondary | ICD-10-CM

## 2024-10-10 LAB — TSH: TSH: 1.9 u[IU]/mL (ref 0.35–5.50)

## 2024-10-10 LAB — VITAMIN B12: Vitamin B-12: 693 pg/mL (ref 211–911)

## 2024-10-10 LAB — COMPREHENSIVE METABOLIC PANEL WITH GFR
ALT: 16 U/L (ref 3–53)
AST: 21 U/L (ref 5–37)
Albumin: 4.3 g/dL (ref 3.5–5.2)
Alkaline Phosphatase: 58 U/L (ref 39–117)
BUN: 19 mg/dL (ref 6–23)
CO2: 35 meq/L — ABNORMAL HIGH (ref 19–32)
Calcium: 9.5 mg/dL (ref 8.4–10.5)
Chloride: 102 meq/L (ref 96–112)
Creatinine, Ser: 1.35 mg/dL (ref 0.40–1.50)
GFR: 63.77 mL/min
Glucose, Bld: 100 mg/dL — ABNORMAL HIGH (ref 70–99)
Potassium: 4.7 meq/L (ref 3.5–5.1)
Sodium: 140 meq/L (ref 135–145)
Total Bilirubin: 0.5 mg/dL (ref 0.2–1.2)
Total Protein: 6.5 g/dL (ref 6.0–8.3)

## 2024-10-10 LAB — CBC
HCT: 43.5 % (ref 39.0–52.0)
Hemoglobin: 14.8 g/dL (ref 13.0–17.0)
MCHC: 33.9 g/dL (ref 30.0–36.0)
MCV: 91.2 fl (ref 78.0–100.0)
Platelets: 110 10*3/uL — ABNORMAL LOW (ref 150.0–400.0)
RBC: 4.77 Mil/uL (ref 4.22–5.81)
RDW: 13 % (ref 11.5–15.5)
WBC: 4.9 10*3/uL (ref 4.0–10.5)

## 2024-10-10 LAB — HEMOGLOBIN A1C: Hgb A1c MFr Bld: 5.9 % (ref 4.6–6.5)

## 2024-10-10 LAB — VITAMIN D 25 HYDROXY (VIT D DEFICIENCY, FRACTURES): VITD: 31.74 ng/mL (ref 30.00–100.00)

## 2024-10-10 NOTE — Patient Instructions (Addendum)
 Stop by the lab prior to leaving today. I will notify you of your results once received.    Follow up with Dermatology as soon as possible. Let me know if you need a referral.   Follow up in 2-3 weeks for fasting labs and physical.   It was a pleasure to meet you today! Please don't hesitate to contact me with any questions. Welcome to [facility]!

## 2024-10-10 NOTE — Progress Notes (Signed)
 "  New Patient Office Visit  Subjective    Patient ID: Chris Baker, male    DOB: 12-Feb-1981  Age: 44 y.o. MRN: [id number]  CC:  Chief Complaint  Patient presents with   New Patient (Initial Visit)    TOC from Dr. Hope Gavel Problem    Patient has had an indentation in his nail x several years and getting worse.    Dizziness    And lightheadedness off and on for several years.     HPI Chris Baker is a 44 y.o. male presents to establish care.  Previous pcp/physical/labs: Dr. Hope.  Last physical: 2019.  Discussed the use of AI scribe software for clinical note transcription with the patient, who gave verbal consent to proceed.  History of Present Illness Chris Baker is a 44 year old male who presents with lightheadedness and nail indentation. He is accompanied by his wife, [name].  He experiences intermittent lightheadedness occurring once or twice a week for several years, lasting about five to ten minutes. These episodes are not associated with positional changes, chest pain, or shortness of breath. He consumes two cups of coffee daily but does not attribute the lightheadedness to caffeine. No visible blood in urine or stool, and no history of head injuries or trauma. Occasionally, he experiences headaches about once a week, resolving within an hour without medication.  He reports a nail indentation on his left thumb that has been present for years and has worsened. It is not present on any other nails or fingers.  He has a history of intermittent asthma but has not used albuterol  for years. He is not on any regular medication except for daily fish oil and a multivitamin. No symptoms such as fever, chills, abnormal weight loss, chest pain, shortness of breath, urinary symptoms, heartburn, nausea, vomiting, diarrhea, constipation, or abdominal pain. Occasionally snores and does not experience anxiety, depression, or insomnia.  He works as a naval architect and reports  a suboptimal diet while on the road, with limited intake of fruits, vegetables, and water.     Outpatient Encounter Medications as of 10/10/2024  Medication Sig   Omega-3 Fatty Acids (FISH OIL) 1000 MG CAPS Take by mouth.   No facility-administered encounter medications on file as of 10/10/2024.    Past Medical History:  Diagnosis Date   Asthma    childhood    Back pain    Chicken pox    COVID-19    04/2020   Herpes simplex infection    Neck pain     Past Surgical History:  Procedure Laterality Date   NO PAST SURGERIES      Family History  Problem Relation Age of Onset   COPD Mother        never smoker    Cancer Father        tongue cancer smoker    Hypertension Father     Social History   Socioeconomic History   Marital status: Married    Spouse name: Not on file   Number of children: Not on file   Years of education: Not on file   Highest education level: Not on file  Occupational History   Not on file  Tobacco Use   Smoking status: Never   Smokeless tobacco: Never  Substance and Sexual Activity   Alcohol use: Yes   Drug use: No   Sexual activity: Yes    Partners: Female  Other Topics Concern   Not  on file  Social History Narrative   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     Wears seatbelt   Owns gun    Feels safe in relationship    Social Drivers of Health   Tobacco Use: Low Risk (10/10/2024)   Patient History    Smoking Tobacco Use: Never    Smokeless Tobacco Use: Never    Passive Exposure: Not on file  Financial Resource Strain: Not on file  Food Insecurity: Not on file  Transportation Needs: Not on file  Physical Activity: Not on file  Stress: Not on file  Social Connections: Not on file  Intimate Partner Violence: Not on file  Depression (PHQ2-9): Low Risk (10/10/2024)   Depression (PHQ2-9)    PHQ-2 Score: 0  Alcohol Screen: Not on file  Housing: Not on file   Utilities: Not on file  Health Literacy: Not on file    Review of Systems  Constitutional:  Negative for chills and fever.  Respiratory:  Negative for shortness of breath.   Cardiovascular:  Negative for chest pain.  Gastrointestinal:  Negative for abdominal pain, blood in stool, constipation, diarrhea, heartburn, nausea and vomiting.  Genitourinary:  Negative for dysuria, frequency and urgency.  Neurological:  Positive for headaches. Negative for dizziness.       Lightheadedness  Endo/Heme/Allergies:  Negative for polydipsia.  Psychiatric/Behavioral:  Negative for depression and suicidal ideas. The patient is not nervous/anxious and does not have insomnia.         Objective    BP 120/80   Pulse 83   Temp 98.9 F (37.2 C) (Temporal)   Ht 5' 7.5 (1.715 m)   Wt 149 lb (67.6 kg)   SpO2 98%   BMI 22.99 kg/m   Physical Exam Vitals and nursing note reviewed.  Constitutional:      Appearance: Normal appearance.  Cardiovascular:     Rate and Rhythm: Normal rate and regular rhythm.     Pulses: Normal pulses.     Heart sounds: Normal heart sounds.  Pulmonary:     Effort: Pulmonary effort is normal.     Breath sounds: Normal breath sounds.  Neurological:     Mental Status: He is alert and oriented to person, place, and time.  Psychiatric:        Mood and Affect: Mood normal.        Behavior: Behavior normal.        Thought Content: Thought content normal.        Judgment: Judgment normal.         Assessment & Plan:  Lightheadedness -     CBC -     Comprehensive metabolic panel with GFR -     Hemoglobin A1c -     TSH -     VITAMIN D  25 Hydroxy (Vit-D Deficiency, Fractures) -     Vitamin B12  Establishing care with new doctor, encounter for Assessment & Plan: EMR reviewed briefly.     Nail pitting  Prediabetes    Assessment and Plan Assessment & Plan Lightheadedness Intermittent episodes possibly related to hypotension, dehydration, thyroid  disorder,  or idiopathic causes. Previous labs negative for autoimmune disorders. - no red flags one exa. - Ordered blood work to rule out anemia, thyroid  disorder, and other potential causes. - If blood work is negative, will order CT scan of the head. - Advised monitoring blood pressure  at home during episodes. - Encouraged increased water intake.  Nail disorder, left thumb Chronic disorder with no signs of fungal infection or malignancy. Dermatology referral recommended. - Follow up with dermatologist Dr. Shona.  Prediabetes Previous labs indicated prediabetes. - Ordered A1c test to assess current glycemic control.     Return in about 2 weeks (around 10/24/2024) for physical and fasting labs.SABRA Carrol Aurora, NP  "

## 2024-10-10 NOTE — Assessment & Plan Note (Signed)
 EMR reviewed briefly.

## 2024-11-21 ENCOUNTER — Encounter: Admitting: General Practice
